# Patient Record
Sex: Female | Born: 1965 | Race: White | Hispanic: No | Marital: Married | Smoking: Current every day smoker
Health system: Southern US, Community
[De-identification: ages and names within clinical notes are randomized; demographics above are authoritative.]

## PROBLEM LIST (undated history)

## (undated) DIAGNOSIS — R609 Edema, unspecified: Secondary | ICD-10-CM

## (undated) DIAGNOSIS — I10 Essential (primary) hypertension: Secondary | ICD-10-CM

## (undated) DIAGNOSIS — R51 Headache: Secondary | ICD-10-CM

## (undated) HISTORY — PX: CHOLECYSTECTOMY: SHX55

## (undated) HISTORY — DX: Essential (primary) hypertension: I10

## (undated) HISTORY — DX: Edema, unspecified: R60.9

## (undated) HISTORY — PX: COLPOSCOPY: SHX161

## (undated) HISTORY — DX: Headache: R51

---

## 1998-05-25 ENCOUNTER — Other Ambulatory Visit: Admission: RE | Admit: 1998-05-25 | Discharge: 1998-05-25 | Payer: Self-pay | Admitting: Obstetrics and Gynecology

## 1999-06-18 ENCOUNTER — Other Ambulatory Visit: Admission: RE | Admit: 1999-06-18 | Discharge: 1999-06-18 | Payer: Self-pay | Admitting: Obstetrics and Gynecology

## 1999-07-25 ENCOUNTER — Encounter (INDEPENDENT_AMBULATORY_CARE_PROVIDER_SITE_OTHER): Payer: Self-pay

## 1999-07-25 ENCOUNTER — Other Ambulatory Visit: Admission: RE | Admit: 1999-07-25 | Discharge: 1999-07-25 | Payer: Self-pay | Admitting: Obstetrics and Gynecology

## 1999-10-29 ENCOUNTER — Other Ambulatory Visit: Admission: RE | Admit: 1999-10-29 | Discharge: 1999-10-29 | Payer: Self-pay | Admitting: Obstetrics and Gynecology

## 1999-12-30 ENCOUNTER — Other Ambulatory Visit: Admission: RE | Admit: 1999-12-30 | Discharge: 1999-12-30 | Payer: Self-pay | Admitting: Obstetrics and Gynecology

## 1999-12-30 ENCOUNTER — Encounter (INDEPENDENT_AMBULATORY_CARE_PROVIDER_SITE_OTHER): Payer: Self-pay | Admitting: Specialist

## 2000-06-29 ENCOUNTER — Other Ambulatory Visit: Admission: RE | Admit: 2000-06-29 | Discharge: 2000-06-29 | Payer: Self-pay | Admitting: Obstetrics and Gynecology

## 2001-06-29 ENCOUNTER — Other Ambulatory Visit: Admission: RE | Admit: 2001-06-29 | Discharge: 2001-06-29 | Payer: Self-pay | Admitting: Obstetrics and Gynecology

## 2002-08-09 ENCOUNTER — Other Ambulatory Visit: Admission: RE | Admit: 2002-08-09 | Discharge: 2002-08-09 | Payer: Self-pay | Admitting: Gynecology

## 2003-01-18 ENCOUNTER — Encounter: Admission: RE | Admit: 2003-01-18 | Discharge: 2003-01-18 | Payer: Self-pay | Admitting: Gynecology

## 2003-02-17 ENCOUNTER — Inpatient Hospital Stay (HOSPITAL_COMMUNITY): Admission: AD | Admit: 2003-02-17 | Discharge: 2003-02-17 | Payer: Self-pay | Admitting: Gynecology

## 2003-03-02 ENCOUNTER — Encounter (INDEPENDENT_AMBULATORY_CARE_PROVIDER_SITE_OTHER): Payer: Self-pay | Admitting: *Deleted

## 2003-03-02 ENCOUNTER — Inpatient Hospital Stay (HOSPITAL_COMMUNITY): Admission: AD | Admit: 2003-03-02 | Discharge: 2003-03-04 | Payer: Self-pay | Admitting: Gynecology

## 2003-04-18 ENCOUNTER — Other Ambulatory Visit: Admission: RE | Admit: 2003-04-18 | Discharge: 2003-04-18 | Payer: Self-pay | Admitting: Gynecology

## 2003-06-10 HISTORY — PX: CERVICAL BIOPSY  W/ LOOP ELECTRODE EXCISION: SUR135

## 2003-11-09 ENCOUNTER — Other Ambulatory Visit: Admission: RE | Admit: 2003-11-09 | Discharge: 2003-11-09 | Payer: Self-pay | Admitting: Gynecology

## 2004-05-10 ENCOUNTER — Other Ambulatory Visit: Admission: RE | Admit: 2004-05-10 | Discharge: 2004-05-10 | Payer: Self-pay | Admitting: Gynecology

## 2004-05-10 ENCOUNTER — Ambulatory Visit (HOSPITAL_COMMUNITY): Admission: RE | Admit: 2004-05-10 | Discharge: 2004-05-10 | Payer: Self-pay | Admitting: Obstetrics and Gynecology

## 2004-11-13 ENCOUNTER — Other Ambulatory Visit: Admission: RE | Admit: 2004-11-13 | Discharge: 2004-11-13 | Payer: Self-pay | Admitting: Gynecology

## 2005-04-28 ENCOUNTER — Other Ambulatory Visit: Admission: RE | Admit: 2005-04-28 | Discharge: 2005-04-28 | Payer: Self-pay | Admitting: Gynecology

## 2006-05-06 ENCOUNTER — Other Ambulatory Visit: Admission: RE | Admit: 2006-05-06 | Discharge: 2006-05-06 | Payer: Self-pay | Admitting: Obstetrics and Gynecology

## 2007-01-29 ENCOUNTER — Ambulatory Visit (HOSPITAL_COMMUNITY): Admission: RE | Admit: 2007-01-29 | Discharge: 2007-01-29 | Payer: Self-pay | Admitting: Obstetrics and Gynecology

## 2007-06-14 ENCOUNTER — Other Ambulatory Visit: Admission: RE | Admit: 2007-06-14 | Discharge: 2007-06-14 | Payer: Self-pay | Admitting: Obstetrics and Gynecology

## 2008-02-24 ENCOUNTER — Ambulatory Visit (HOSPITAL_COMMUNITY): Admission: RE | Admit: 2008-02-24 | Discharge: 2008-02-24 | Payer: Self-pay | Admitting: Obstetrics and Gynecology

## 2008-06-15 ENCOUNTER — Other Ambulatory Visit: Admission: RE | Admit: 2008-06-15 | Discharge: 2008-06-15 | Payer: Self-pay | Admitting: Obstetrics and Gynecology

## 2008-06-15 ENCOUNTER — Encounter: Payer: Self-pay | Admitting: Obstetrics and Gynecology

## 2008-06-15 ENCOUNTER — Ambulatory Visit: Payer: Self-pay | Admitting: Obstetrics and Gynecology

## 2009-08-09 ENCOUNTER — Other Ambulatory Visit: Admission: RE | Admit: 2009-08-09 | Discharge: 2009-08-09 | Payer: Self-pay | Admitting: Obstetrics and Gynecology

## 2009-08-09 ENCOUNTER — Ambulatory Visit: Payer: Self-pay | Admitting: Obstetrics and Gynecology

## 2009-08-10 ENCOUNTER — Ambulatory Visit (HOSPITAL_COMMUNITY): Admission: RE | Admit: 2009-08-10 | Discharge: 2009-08-10 | Payer: Self-pay | Admitting: Obstetrics and Gynecology

## 2010-07-01 ENCOUNTER — Encounter: Payer: Self-pay | Admitting: Obstetrics and Gynecology

## 2010-07-09 ENCOUNTER — Other Ambulatory Visit: Payer: Self-pay | Admitting: Obstetrics and Gynecology

## 2010-07-09 DIAGNOSIS — Z1231 Encounter for screening mammogram for malignant neoplasm of breast: Secondary | ICD-10-CM

## 2010-07-09 DIAGNOSIS — Z139 Encounter for screening, unspecified: Secondary | ICD-10-CM

## 2010-08-12 ENCOUNTER — Ambulatory Visit (HOSPITAL_COMMUNITY)
Admission: RE | Admit: 2010-08-12 | Discharge: 2010-08-12 | Disposition: A | Discharge status: Home / Self Care | Payer: Managed Care, Other (non HMO) | Source: Ambulatory Visit | Attending: Obstetrics and Gynecology | Admitting: Obstetrics and Gynecology

## 2010-08-12 DIAGNOSIS — Z1231 Encounter for screening mammogram for malignant neoplasm of breast: Secondary | ICD-10-CM

## 2010-08-13 ENCOUNTER — Encounter (INDEPENDENT_AMBULATORY_CARE_PROVIDER_SITE_OTHER): Payer: Managed Care, Other (non HMO) | Admitting: Obstetrics and Gynecology

## 2010-08-13 ENCOUNTER — Other Ambulatory Visit (HOSPITAL_COMMUNITY)
Admission: RE | Admit: 2010-08-13 | Discharge: 2010-08-13 | Disposition: A | Discharge status: Home / Self Care | Payer: Managed Care, Other (non HMO) | Source: Ambulatory Visit | Attending: Obstetrics and Gynecology | Admitting: Obstetrics and Gynecology

## 2010-08-13 ENCOUNTER — Other Ambulatory Visit: Payer: Self-pay | Admitting: Obstetrics and Gynecology

## 2010-08-13 DIAGNOSIS — Z13220 Encounter for screening for lipoid disorders: Secondary | ICD-10-CM

## 2010-08-13 DIAGNOSIS — Z01419 Encounter for gynecological examination (general) (routine) without abnormal findings: Secondary | ICD-10-CM

## 2010-08-13 DIAGNOSIS — R82998 Other abnormal findings in urine: Secondary | ICD-10-CM

## 2010-08-13 DIAGNOSIS — Z124 Encounter for screening for malignant neoplasm of cervix: Secondary | ICD-10-CM | POA: Insufficient documentation

## 2010-08-28 ENCOUNTER — Ambulatory Visit (INDEPENDENT_AMBULATORY_CARE_PROVIDER_SITE_OTHER): Payer: Managed Care, Other (non HMO) | Admitting: Obstetrics and Gynecology

## 2010-08-28 DIAGNOSIS — N920 Excessive and frequent menstruation with regular cycle: Secondary | ICD-10-CM

## 2010-09-04 ENCOUNTER — Ambulatory Visit (INDEPENDENT_AMBULATORY_CARE_PROVIDER_SITE_OTHER): Payer: Managed Care, Other (non HMO) | Admitting: Obstetrics and Gynecology

## 2010-09-04 ENCOUNTER — Other Ambulatory Visit: Payer: Managed Care, Other (non HMO)

## 2010-09-04 DIAGNOSIS — N8000 Endometriosis of the uterus, unspecified: Secondary | ICD-10-CM

## 2010-09-04 DIAGNOSIS — N920 Excessive and frequent menstruation with regular cycle: Secondary | ICD-10-CM

## 2010-09-04 DIAGNOSIS — Z30431 Encounter for routine checking of intrauterine contraceptive device: Secondary | ICD-10-CM

## 2010-09-16 ENCOUNTER — Ambulatory Visit (INDEPENDENT_AMBULATORY_CARE_PROVIDER_SITE_OTHER): Payer: Managed Care, Other (non HMO) | Admitting: Gynecology

## 2010-09-16 DIAGNOSIS — N920 Excessive and frequent menstruation with regular cycle: Secondary | ICD-10-CM

## 2010-10-07 ENCOUNTER — Ambulatory Visit (INDEPENDENT_AMBULATORY_CARE_PROVIDER_SITE_OTHER): Payer: Managed Care, Other (non HMO) | Admitting: Obstetrics and Gynecology

## 2010-10-07 DIAGNOSIS — N949 Unspecified condition associated with female genital organs and menstrual cycle: Secondary | ICD-10-CM

## 2010-10-25 NOTE — Discharge Summary (Signed)
   NAME:  Kelli Adams, Kelli Adams                    ACCOUNT NO.:  [id number]   MEDICAL RECORD NO.:  [id number]                   PATIENT TYPE:  INP   LOCATION:  9101                                 FACILITY:  [facility]   PHYSICIAN:  Juan H. Lily Peer, M.D.             DATE OF BIRTH:  April 22, 1966   DATE OF ADMISSION:  03/02/2003  DATE OF DISCHARGE:  03/04/2003                                 DISCHARGE SUMMARY   DISCHARGE DIAGNOSES:  1. Intrauterine pregnancy at 39 weeks delivered.  2. Pregnancy-induced hypertension.  3. Status post spontaneous vaginal delivery.   HISTORY:  This is a 45 year old female gravida 2, para 0, abortus 1 with an  EDC of March 05, 2003.  The patient was to be 45 years of age at the  time of birth, declined amniocentesis.  She was followed for pregnancy-  induced hypertension and she has been on bed rest secondary to the increased  blood pressure.   HOSPITAL COURSE:  On March 02, 2003 the patient was admitted at 39+  weeks for induction secondary to [facility].  Pitocin was begun and subsequently on  March 02, 2003 the patient underwent spontaneous vaginal delivery of a  female.  Apgars of 9 and 9, weight of 6 pounds and 10 ounces.  There was a  first-degree laceration which was repaired over the perineum.  There were no  complications.  Postpartum the patient remained afebrile, voiding, in stable  condition.  She was discharged to home on March 04, 2003 in satisfactory  condition.  Given [facility] brochure and postpartum booklet.   PHYSICAL EXAMINATION:  No findings.   LABORATORY AND ACCESSORY DATA:  The patient O positive, rubella immune.  On  March 03, 2003 hemoglobin was 12.3.   DISCHARGE INSTRUCTIONS:  The patient was discharged to home, follow up in  six weeks, given a prescription for Demerol 50 mg 1/2 tab p.o. q.4-6 h.  p.r.n. pain.     Susa Loffler, P.A.                    Juan H. Lily Peer, M.D.    TSG/MEDQ  D:  04/07/2003   T:  04/07/2003  Job:  [id number]

## 2011-07-09 ENCOUNTER — Other Ambulatory Visit: Payer: Self-pay | Admitting: Obstetrics and Gynecology

## 2011-07-09 DIAGNOSIS — Z1231 Encounter for screening mammogram for malignant neoplasm of breast: Secondary | ICD-10-CM

## 2011-08-12 ENCOUNTER — Encounter: Payer: Self-pay | Admitting: Gynecology

## 2011-08-12 DIAGNOSIS — N879 Dysplasia of cervix uteri, unspecified: Secondary | ICD-10-CM | POA: Insufficient documentation

## 2011-08-13 ENCOUNTER — Ambulatory Visit (HOSPITAL_COMMUNITY)
Admission: RE | Admit: 2011-08-13 | Discharge: 2011-08-13 | Disposition: A | Discharge status: Home / Self Care | Payer: Managed Care, Other (non HMO) | Source: Ambulatory Visit | Attending: Obstetrics and Gynecology | Admitting: Obstetrics and Gynecology

## 2011-08-13 DIAGNOSIS — Z1231 Encounter for screening mammogram for malignant neoplasm of breast: Secondary | ICD-10-CM | POA: Insufficient documentation

## 2011-08-19 ENCOUNTER — Other Ambulatory Visit (HOSPITAL_COMMUNITY)
Admission: RE | Admit: 2011-08-19 | Discharge: 2011-08-19 | Disposition: A | Discharge status: Home / Self Care | Payer: Managed Care, Other (non HMO) | Source: Ambulatory Visit | Attending: Obstetrics and Gynecology | Admitting: Obstetrics and Gynecology

## 2011-08-19 ENCOUNTER — Ambulatory Visit (INDEPENDENT_AMBULATORY_CARE_PROVIDER_SITE_OTHER): Payer: Managed Care, Other (non HMO) | Admitting: Obstetrics and Gynecology

## 2011-08-19 ENCOUNTER — Encounter: Payer: Self-pay | Admitting: Obstetrics and Gynecology

## 2011-08-19 VITALS — BP 120/80 | Ht 62.0 in | Wt 170.0 lb

## 2011-08-19 DIAGNOSIS — R51 Headache: Secondary | ICD-10-CM | POA: Insufficient documentation

## 2011-08-19 DIAGNOSIS — Z01419 Encounter for gynecological examination (general) (routine) without abnormal findings: Secondary | ICD-10-CM

## 2011-08-19 DIAGNOSIS — R609 Edema, unspecified: Secondary | ICD-10-CM | POA: Insufficient documentation

## 2011-08-19 DIAGNOSIS — I10 Essential (primary) hypertension: Secondary | ICD-10-CM | POA: Insufficient documentation

## 2011-08-19 DIAGNOSIS — R519 Headache, unspecified: Secondary | ICD-10-CM | POA: Insufficient documentation

## 2011-08-19 NOTE — Progress Notes (Signed)
The patient came back to see me today for her annual GYN exam. Since we placed her Mirena IUD her severe menorrhagia has been replaced by very light spotting for several days. She does however still gets  one day of severe dysmenorrhea. She will occasionally get pain on her left side with increased activity and she wonders if it's related to her IUD been misplaced. She had her mammogram this week. She does lab through PCP. She has recently noticed a lump in her right hip.  Physical examination: HEENT within normal limits. Neck: Thyroid not large. No masses. Supraclavicular nodes: not enlarged. Breasts: Examined in both sitting midline position. No skin changes and no masses. Abdomen: Soft no guarding rebound or masses or hernia. Pelvic: External: Within normal limits. BUS: Within normal limits. Vaginal:within normal limits. Good estrogen effect. No evidence of cystocele rectocele or enterocele. Cervix: clean IUD in place. Uterus: Normal size and shape. Adnexa: No masses. Rectovaginal exam: Confirmatory and negative. Extremities: Within normal limits. Skin: On her right lateral thigh there is a 2-3 mm firm nodule consistent with benign growth.  Assessment: 1. Relief of menorrhagia with IUD. #2. Dysmenorrhea for one day #3. Small nodule of right thigh.  Plan: Patient will use ibuprofen for the dysmenorrhea. Reassured IUD in place. However explained to her that she could have some endo myometrial penetration from her IUD and if it was annoying enough to her ultrasound could be used to make a diagnosis. For the moment she declined. She will watch the nodule on her thigh and report any growth to me.

## 2011-08-20 LAB — URINALYSIS W MICROSCOPIC + REFLEX CULTURE
Bacteria, UA: NONE SEEN
Bilirubin Urine: NEGATIVE
Casts: NONE SEEN
Glucose, UA: NEGATIVE mg/dL
Hgb urine dipstick: NEGATIVE
Ketones, ur: NEGATIVE mg/dL
Nitrite: NEGATIVE
Protein, ur: NEGATIVE mg/dL
Specific Gravity, Urine: 1.016 (ref 1.005–1.030)
Urobilinogen, UA: 0.2 mg/dL (ref 0.0–1.0)
pH: 6 (ref 5.0–8.0)

## 2011-08-21 LAB — URINE CULTURE
Colony Count: NO GROWTH
Organism ID, Bacteria: NO GROWTH

## 2012-07-19 ENCOUNTER — Other Ambulatory Visit: Payer: Self-pay | Admitting: Obstetrics and Gynecology

## 2012-07-19 DIAGNOSIS — Z1231 Encounter for screening mammogram for malignant neoplasm of breast: Secondary | ICD-10-CM

## 2012-08-12 ENCOUNTER — Other Ambulatory Visit: Payer: Self-pay | Admitting: Gynecology

## 2012-08-13 ENCOUNTER — Ambulatory Visit (HOSPITAL_COMMUNITY): Payer: Managed Care, Other (non HMO)

## 2012-08-23 ENCOUNTER — Ambulatory Visit: Payer: BC Managed Care – PPO | Admitting: Women's Health

## 2012-08-23 ENCOUNTER — Encounter: Payer: Self-pay | Admitting: Women's Health

## 2012-08-23 ENCOUNTER — Other Ambulatory Visit (HOSPITAL_COMMUNITY)
Admission: RE | Admit: 2012-08-23 | Discharge: 2012-08-23 | Disposition: A | Discharge status: Home / Self Care | Payer: BC Managed Care – PPO | Source: Ambulatory Visit | Attending: Obstetrics and Gynecology | Admitting: Obstetrics and Gynecology

## 2012-08-23 ENCOUNTER — Telehealth: Payer: Self-pay

## 2012-08-23 ENCOUNTER — Ambulatory Visit (HOSPITAL_COMMUNITY)
Admission: RE | Admit: 2012-08-23 | Discharge: 2012-08-23 | Disposition: A | Discharge status: Home / Self Care | Payer: BC Managed Care – PPO | Source: Ambulatory Visit | Attending: Gynecology | Admitting: Gynecology

## 2012-08-23 ENCOUNTER — Ambulatory Visit (HOSPITAL_COMMUNITY): Payer: Managed Care, Other (non HMO)

## 2012-08-23 VITALS — BP 124/72 | Ht 63.0 in | Wt 164.0 lb

## 2012-08-23 DIAGNOSIS — Z01419 Encounter for gynecological examination (general) (routine) without abnormal findings: Secondary | ICD-10-CM

## 2012-08-23 DIAGNOSIS — Z1231 Encounter for screening mammogram for malignant neoplasm of breast: Secondary | ICD-10-CM | POA: Insufficient documentation

## 2012-08-23 LAB — PREGNANCY, URINE: Preg Test, Ur: NEGATIVE

## 2012-08-23 NOTE — Patient Instructions (Addendum)

## 2012-08-23 NOTE — Progress Notes (Signed)
Kelli Adams [birth year]    History:    The patient presents for annual exam.  Occasional spotting with Mirena IUD placed 08/2010, good relief of menorrhagia. 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 Normal mammograms. Hypertension/anxiety/depression-primary care meds. Smokes half pack of cigarettes per day. Negative sonohysterogram 08/2010, cortical cysts consistent with adenomyosis noted on uterus.   Past medical history, past surgical history, family history and social history were all reviewed and documented in the EPIC chart. Works for [employer]. [employer], skipped a grade. Benign cervical polyp 2010.   ROS:  A  ROS was performed and pertinent positives and negatives are included in the history.  Exam:  Filed Vitals:   08/23/12 0815  BP: 124/72    General appearance:  Normal Head/Neck:  Normal, without cervical or supraclavicular adenopathy. Thyroid:  Symmetrical, normal in size, without palpable masses or nodularity. Respiratory  Effort:  Normal  Auscultation:  Clear without wheezing or rhonchi Cardiovascular  Auscultation:  Regular rate, without rubs, murmurs or gallops  Edema/varicosities:  Not grossly evident Abdominal  Soft,nontender, without masses, guarding or rebound.  Liver/spleen:  No organomegaly noted  Hernia:  None appreciated  Skin  Inspection:  Grossly normal  Palpation:  Grossly normal Neurologic/psychiatric  Orientation:  Normal with appropriate conversation.  Mood/affect:  Normal  Genitourinary    Breasts: Examined lying and sitting.     Right: Without masses, retractions, discharge or axillary adenopathy.     Left: Without masses, retractions, discharge or axillary adenopathy.   Inguinal/mons:  Normal without inguinal adenopathy  External genitalia:  Normal  BUS/Urethra/Skene's glands:  Normal  Bladder:  Normal  Vagina:  Normal  Cervix:  Normal, IUD strings visible  Uterus:   normal in size,  shape and contour.  Midline and mobile  Adnexa/parametria:     Rt: Without masses or tenderness.   Lt: Without masses or tenderness.  Anus and perineum: Normal  Digital rectal exam: Normal sphincter tone without palpated masses or tenderness  Assessment/Plan:  47 y.o.  MWF G3P1 for annual exam with no complaints.  Mirena IUD occasional spotting placed 08/2010 CIN-3-LEEP 2005 normal Paps after Hypertension/anxiety/depression-labs and meds primary care Smoker less than half pack per day  Plan: Continue regular exercise, SBE's, annual mammogram, calcium rich diet, vitamin D 1000 daily encouraged. Aware of hazards of smoking, discussed weeks to decrease/quit smoking. Has had some a.m. Nausea questions pregnancy, U PT negative. UA, Pap.    Harrington Challenger [facility], 9:15 AM 08/23/2012

## 2012-08-23 NOTE — Progress Notes (Signed)
SPOTTING ONLY WITH IUD

## 2012-08-23 NOTE — Telephone Encounter (Signed)
Patient called stating she needs a doctor's note from you stating that she had to be home to rest today.  She saw you for her RGCE this morning first thing.  I did not see mention in your office note that absence from work would be necessary today.  I can simply write that letter for you if that is something you wish me to do for her. Just let me know.

## 2012-08-23 NOTE — Telephone Encounter (Signed)
She was somewhat nauseous, I am guessing that's why she stayed out of work. Okay to write a note for today. thanks

## 2012-08-23 NOTE — Addendum Note (Signed)
Addended by: Richardson Chiquito on: 08/23/2012 09:32 AM   Modules accepted: Orders

## 2012-08-24 ENCOUNTER — Encounter: Payer: Self-pay | Admitting: Obstetrics and Gynecology

## 2012-08-24 LAB — URINALYSIS W MICROSCOPIC + REFLEX CULTURE
Bacteria, UA: NONE SEEN
Casts: NONE SEEN
Crystals: NONE SEEN
Glucose, UA: NEGATIVE mg/dL
Leukocytes, UA: NEGATIVE
Nitrite: NEGATIVE
Protein, ur: NEGATIVE mg/dL
Specific Gravity, Urine: 1.029 (ref 1.005–1.030)
Urobilinogen, UA: 0.2 mg/dL (ref 0.0–1.0)
pH: 6.5 (ref 5.0–8.0)

## 2012-08-24 NOTE — Telephone Encounter (Signed)
Letter faxed to [employer] at patients request per [place]. Copy is in e-chart.

## 2013-02-24 ENCOUNTER — Ambulatory Visit (INDEPENDENT_AMBULATORY_CARE_PROVIDER_SITE_OTHER): Payer: BC Managed Care – PPO | Admitting: Women's Health

## 2013-02-24 ENCOUNTER — Encounter: Payer: Self-pay | Admitting: Women's Health

## 2013-02-24 DIAGNOSIS — F411 Generalized anxiety disorder: Secondary | ICD-10-CM

## 2013-02-24 DIAGNOSIS — N951 Menopausal and female climacteric states: Secondary | ICD-10-CM

## 2013-02-24 MED ORDER — CITALOPRAM HYDROBROMIDE 20 MG PO TABS: 40.0000 mg | ORAL_TABLET | Freq: Every day | ORAL | Status: DC

## 2013-02-24 MED ORDER — PROMETHAZINE HCL 25 MG PO TABS: ORAL_TABLET | ORAL | Status: DC

## 2013-02-24 NOTE — Progress Notes (Signed)
Patient ID: Kelli Adams, female   DOB: 08/17/1965, 47 y.o.   MRN: [id number] Presents with complaint of anxiety, tension, unable to sleep, nausea with no vomiting, frequent crying and generally feeling poorly. Upper back tension. Has noticed thinning hair, increased hot flushes. Mirena IUD with rare cycles, last cycle 4 days ago. Denies any precipitating factors, states home life is good, work is okay, son is doing well. States all her primary care who recommended her to see a psychiatrist. 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 Denies any feelings of harming self. Has been on Celexa 20 mg for about 10 years. Denies alcohol use.  Exam: Upset, crying, states unable to relax. Abdomen soft, nontender, external genitalia within normal limits, speculum exam moderate amount of menses type blood noted, IUD strings visible. Bimanual no CMT or adnexal fullness or tenderness.  Anxiety  Plan: TSH, FSH, schedule followup with psychiatrist for medication, we will get referral to Dr Ann Maki [facility] office. Will increase Celexa from 20-40 mg daily, reviewed importance of increasing regular exercise for stress relief, delegating chores at home. Phenergan 25 for nausea reviewed to take in the evening only not prior to driving or work.

## 2013-02-24 NOTE — Patient Instructions (Addendum)

## 2013-02-25 ENCOUNTER — Telehealth: Payer: Self-pay | Admitting: *Deleted

## 2013-02-25 NOTE — Telephone Encounter (Signed)
Per Harriett Sine she would like patient to referral to psychiatrist for medication. Pt will be contacted by [facility] outpatient behavioral health [phone] I was informed to fax notes and they will contact pt to scheduled, so this was done I called pt and told her this.

## 2013-03-07 NOTE — Telephone Encounter (Signed)
Left message on pt voicemail to see if scheduled for appointment.

## 2013-03-09 MED ORDER — PROPRANOLOL HCL 40 MG PO TABS: 40.0000 mg | ORAL_TABLET | Freq: Three times a day (TID) | ORAL | Status: DC | PRN

## 2013-03-09 NOTE — Telephone Encounter (Signed)
Has she increased the Celexa from 20-40, is that helping at all?  She cannot take a Xanax-type /NO benzodiazepines medication, history of seizures with Xanax.

## 2013-03-09 NOTE — Telephone Encounter (Signed)
.  per nancy verbally it should be 40 mg not 80 mg. So 40 mg was sent to pharmacy. Pt informed.

## 2013-03-09 NOTE — Telephone Encounter (Signed)
Pt has appointment scheduled with cone at the end of the month, pt said is there anything she could take until then? Please advise

## 2013-03-09 NOTE — Telephone Encounter (Signed)
Could try propranolol 80mg  every 8 hrs prn  #30 no refills

## 2013-04-08 ENCOUNTER — Ambulatory Visit (HOSPITAL_COMMUNITY): Payer: BC Managed Care – PPO | Admitting: Psychiatry

## 2013-04-14 ENCOUNTER — Other Ambulatory Visit: Payer: Self-pay

## 2013-04-19 ENCOUNTER — Ambulatory Visit (INDEPENDENT_AMBULATORY_CARE_PROVIDER_SITE_OTHER): Payer: BC Managed Care – PPO | Admitting: Women's Health

## 2013-04-19 ENCOUNTER — Encounter: Payer: Self-pay | Admitting: Women's Health

## 2013-04-19 DIAGNOSIS — Z30432 Encounter for removal of intrauterine contraceptive device: Secondary | ICD-10-CM

## 2013-04-19 NOTE — Progress Notes (Signed)
Patient ID: Kelli Adams, female   DOB: [birth year], 47 y.o.   MRN: [id number] Presents to have Mirena IUD removed. Mirena IUD placed 07/2010 for menorrhagia. Has been amenorrheic. Rare intercourse. Struggling with severe anxiety and panic. Reports increased hair loss since Mirena placed. Seeing a counselor and psychiatrist without much relief. Denies feelings of harming self. Problems with insomnia. 70 year old son Vickki Muff still sleeps with her.  Exam: Appears sad, tearful at times. External genitalia within normal limits, speculum exam IUD strings visible, IUD removed intact, shown to patient and discarded.  Anxiety Contraception management  Plan: Contraception options reviewed, will use condoms. Encouraged to continue meds as prescribed, counseling, try a different counselor, Raynelle Fanning Whitt's name and number given. Instructed to call if problems with cycles, reviewed may not get a cycle immediately. Reviewed importance of regular exercise. Sleep hygiene reviewed, encouraged to try to have son start sleeping on his own.

## 2013-04-19 NOTE — Patient Instructions (Signed)

## 2013-04-20 ENCOUNTER — Ambulatory Visit: Payer: BC Managed Care – PPO | Admitting: Women's Health

## 2013-10-25 ENCOUNTER — Other Ambulatory Visit: Payer: Self-pay | Admitting: Women's Health

## 2013-10-25 DIAGNOSIS — Z1231 Encounter for screening mammogram for malignant neoplasm of breast: Secondary | ICD-10-CM

## 2013-11-04 ENCOUNTER — Ambulatory Visit (HOSPITAL_COMMUNITY): Payer: BC Managed Care – PPO

## 2013-11-07 ENCOUNTER — Ambulatory Visit (HOSPITAL_COMMUNITY)
Admission: RE | Admit: 2013-11-07 | Discharge: 2013-11-07 | Disposition: A | Discharge status: Home / Self Care | Payer: BC Managed Care – PPO | Source: Ambulatory Visit | Attending: Women's Health | Admitting: Women's Health

## 2013-11-07 DIAGNOSIS — Z1231 Encounter for screening mammogram for malignant neoplasm of breast: Secondary | ICD-10-CM | POA: Insufficient documentation

## 2013-11-09 ENCOUNTER — Encounter: Payer: Self-pay | Admitting: Women's Health

## 2014-04-10 ENCOUNTER — Encounter: Payer: Self-pay | Admitting: Women's Health

## 2015-07-10 ENCOUNTER — Other Ambulatory Visit: Payer: Self-pay

## 2015-07-10 DIAGNOSIS — Z1231 Encounter for screening mammogram for malignant neoplasm of breast: Secondary | ICD-10-CM

## 2015-07-18 ENCOUNTER — Encounter: Payer: Self-pay | Admitting: Women's Health

## 2015-07-18 ENCOUNTER — Other Ambulatory Visit: Payer: Self-pay | Admitting: Women's Health

## 2015-07-18 ENCOUNTER — Other Ambulatory Visit (HOSPITAL_COMMUNITY)
Admission: RE | Admit: 2015-07-18 | Discharge: 2015-07-18 | Disposition: A | Discharge status: Home / Self Care | Payer: 59 | Source: Ambulatory Visit | Attending: Gynecology | Admitting: Gynecology

## 2015-07-18 ENCOUNTER — Ambulatory Visit
Admission: RE | Admit: 2015-07-18 | Discharge: 2015-07-18 | Disposition: A | Discharge status: Home / Self Care | Payer: 59 | Source: Ambulatory Visit

## 2015-07-18 ENCOUNTER — Ambulatory Visit (INDEPENDENT_AMBULATORY_CARE_PROVIDER_SITE_OTHER): Payer: 59 | Admitting: Women's Health

## 2015-07-18 VITALS — BP 132/80 | Ht 62.0 in | Wt 189.0 lb

## 2015-07-18 DIAGNOSIS — Z1329 Encounter for screening for other suspected endocrine disorder: Secondary | ICD-10-CM | POA: Diagnosis not present

## 2015-07-18 DIAGNOSIS — Z01419 Encounter for gynecological examination (general) (routine) without abnormal findings: Secondary | ICD-10-CM | POA: Insufficient documentation

## 2015-07-18 DIAGNOSIS — Z1231 Encounter for screening mammogram for malignant neoplasm of breast: Secondary | ICD-10-CM

## 2015-07-18 DIAGNOSIS — Z833 Family history of diabetes mellitus: Secondary | ICD-10-CM

## 2015-07-18 NOTE — Addendum Note (Signed)
Addended by: Burnett Kanaris on: 07/18/2015 04:10 PM   Modules accepted: Orders, SmartSet

## 2015-07-18 NOTE — Progress Notes (Signed)
[place] 1966-03-06 [id number]    History:    Presents for annual exam.  Monthly cycle/rare intercourse/condoms. 2005 LEEP for CIN-3 negative margins, with normal Paps after.  Mammogram history normal. History of hypertension and anxiety and depression-primary care manages. Normal labs at health screening, states blood sugar slightly elevated. Negative colonoscopy less than 10 years ago was having GI issues. History of GDM.  Past medical history, past surgical history, family history and social history were all reviewed and documented in the EPIC chart. 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  ROS:  A ROS was performed and pertinent positives and negatives are included.  Exam:  Filed Vitals:   07/18/15 1442  BP: 132/80    General appearance:  Normal Thyroid:  Symmetrical, normal in size, without palpable masses or nodularity. Respiratory  Auscultation:  Clear without wheezing or rhonchi Cardiovascular  Auscultation:  Regular rate, without rubs, murmurs or gallops  Edema/varicosities:  Not grossly evident Abdominal  Soft,nontender, without masses, guarding or rebound.  Liver/spleen:  No organomegaly noted  Hernia:  None appreciated  Skin  Inspection:  Grossly normal   Breasts: Examined lying and sitting.     Right: Without masses, retractions, discharge or axillary adenopathy.     Left: Without masses, retractions, discharge or axillary adenopathy. Gentitourinary   Inguinal/mons:  Normal without inguinal adenopathy  External genitalia:  Normal  BUS/Urethra/Skene's glands:  Normal  Vagina:  Normal  Cervix:  Normal stenotic  Uterus:  normal in size, shape and contour.  Midline and mobile  Adnexa/parametria:     Rt: Without masses or tenderness.   Lt: Without masses or tenderness.  Anus and perineum: Normal  Digital rectal exam: Normal sphincter tone without palpated masses or tenderness  Assessment/Plan:  50 y.o. MWF G3 P1 for annual exam with no  complaints.   Regular monthly cycle/rare intercourse/condoms 2005 LEEP for CIN-3 normal Paps after Hypertension, anxiety and depression-primary care manages labs and meds Smoker  Plan: SBE's, continue annual screening mammogram. Reviewed importance of decreasing calories, increasing exercise, has gained approximately 20 pounds in the past 2 years. Calcium rich diet, vitamin D 1000 daily encouraged. Aware of hazards of smoking, smoking cessation reviewed. TSH, hemoglobin A1c, UA, Pap with HR HPV typing.      Huel Cote Navos, 3:31 PM 07/18/2015

## 2015-07-18 NOTE — Patient Instructions (Signed)
Menopause is a normal process in which your reproductive ability comes to an end. This process happens gradually over a span of months to years, usually between the ages of 48 and 55. Menopause is complete when you have missed 12 consecutive menstrual periods. It is important to talk with your health care provider about some of the most common conditions that affect postmenopausal women, such as heart disease, cancer, and bone loss (osteoporosis). Adopting a healthy lifestyle and getting preventive care can help to promote your health and wellness. Those actions can also lower your chances of developing some of these common conditions. WHAT SHOULD I KNOW ABOUT MENOPAUSE? During menopause, you may experience a number of symptoms, such as:  Moderate-to-severe hot flashes.  Night sweats.  Decrease in sex drive.  Mood swings.  Headaches.  Tiredness.  Irritability.  Memory problems.  Insomnia. Choosing to treat or not to treat menopausal changes is an individual decision that you make with your health care provider. WHAT SHOULD I KNOW ABOUT HORMONE REPLACEMENT THERAPY AND SUPPLEMENTS? Hormone therapy products are effective for treating symptoms that are associated with menopause, such as hot flashes and night sweats. Hormone replacement carries certain risks, especially as you become older. If you are thinking about using estrogen or estrogen with progestin treatments, discuss the benefits and risks with your health care provider. WHAT SHOULD I KNOW ABOUT HEART DISEASE AND STROKE? Heart disease, heart attack, and stroke become more likely as you age. This may be due, in part, to the hormonal changes that your body experiences during menopause. These can affect how your body processes dietary fats, triglycerides, and cholesterol. Heart attack and stroke are both medical emergencies. There are many things that you can do to help prevent heart disease and stroke:  Have your blood pressure  checked at least every 1-2 years. High blood pressure causes heart disease and increases the risk of stroke.  If you are 55-79 years old, ask your health care provider if you should take aspirin to prevent a heart attack or a stroke.  Do not use any tobacco products, including cigarettes, chewing tobacco, or electronic cigarettes. If you need help quitting, ask your health care provider.  It is important to eat a healthy diet and maintain a healthy weight.  Be sure to include plenty of vegetables, fruits, low-fat dairy products, and lean protein.  Avoid eating foods that are high in solid fats, added sugars, or salt (sodium).  Get regular exercise. This is one of the most important things that you can do for your health.  Try to exercise for at least 150 minutes each week. The type of exercise that you do should increase your heart rate and make you sweat. This is known as moderate-intensity exercise.  Try to do strengthening exercises at least twice each week. Do these in addition to the moderate-intensity exercise.  Know your numbers.Ask your health care provider to check your cholesterol and your blood glucose. Continue to have your blood tested as directed by your health care provider. WHAT SHOULD I KNOW ABOUT CANCER SCREENING? There are several types of cancer. Take the following steps to reduce your risk and to catch any cancer development as early as possible. Breast Cancer  Practice breast self-awareness.  This means understanding how your breasts normally appear and feel.  It also means doing regular breast self-exams. Let your health care provider know about any changes, no matter how small.  If you are 40 or older, have a clinician do a   breast exam (clinical breast exam or CBE) every year. Depending on your age, family history, and medical history, it may be recommended that you also have a yearly breast X-ray (mammogram).  If you have a family history of breast cancer,  talk with your health care provider about genetic screening.  If you are at high risk for breast cancer, talk with your health care provider about having an MRI and a mammogram every year.  Breast cancer (BRCA) gene test is recommended for women who have family members with BRCA-related cancers. Results of the assessment will determine the need for genetic counseling and BRCA1 and for BRCA2 testing. BRCA-related cancers include these types:  Breast. This occurs in males or females.  Ovarian.  Tubal. This may also be called fallopian tube cancer.  Cancer of the abdominal or pelvic lining (peritoneal cancer).  Prostate.  Pancreatic. Cervical, Uterine, and Ovarian Cancer Your health care provider may recommend that you be screened regularly for cancer of the pelvic organs. These include your ovaries, uterus, and vagina. This screening involves a pelvic exam, which includes checking for microscopic changes to the surface of your cervix (Pap test).  For women ages 21-65, health care providers may recommend a pelvic exam and a Pap test every three years. For women ages 77-65, they may recommend the Pap test and pelvic exam, combined with testing for human papilloma virus (HPV), every five years. Some types of HPV increase your risk of cervical cancer. Testing for HPV may also be done on women of any age who have unclear Pap test results.  Other health care providers may not recommend any screening for nonpregnant women who are considered low risk for pelvic cancer and have no symptoms. Ask your health care provider if a screening pelvic exam is right for you.  If you have had past treatment for cervical cancer or a condition that could lead to cancer, you need Pap tests and screening for cancer for at least 20 years after your treatment. If Pap tests have been discontinued for you, your risk factors (such as having a new sexual partner) need to be reassessed to determine if you should start having  screenings again. Some women have medical problems that increase the chance of getting cervical cancer. In these cases, your health care provider may recommend that you have screening and Pap tests more often.  If you have a family history of uterine cancer or ovarian cancer, talk with your health care provider about genetic screening.  If you have vaginal bleeding after reaching menopause, tell your health care provider.  There are currently no reliable tests available to screen for ovarian cancer. Lung Cancer Lung cancer screening is recommended for adults 3-70 years old who are at high risk for lung cancer because of a history of smoking. A yearly low-dose CT scan of the lungs is recommended if you:  Currently smoke.  Have a history of at least 30 pack-years of smoking and you currently smoke or have quit within the past 15 years. A pack-year is smoking an average of one pack of cigarettes per day for one year. Yearly screening should:  Continue until it has been 15 years since you quit.  Stop if you develop a health problem that would prevent you from having lung cancer treatment. Colorectal Cancer  This type of cancer can be detected and can often be prevented.  Routine colorectal cancer screening usually begins at age 38 and continues through age 12.  If you have  risk factors for colon cancer, your health care provider may recommend that you be screened at an earlier age.  If you have a family history of colorectal cancer, talk with your health care provider about genetic screening.  Your health care provider may also recommend using home test kits to check for hidden blood in your stool.  A small camera at the end of a tube can be used to examine your colon directly (sigmoidoscopy or colonoscopy). This is done to check for the earliest forms of colorectal cancer.  Direct examination of the colon should be repeated every 5-10 years until age 67. However, if early forms of  precancerous polyps or small growths are found or if you have a family history or genetic risk for colorectal cancer, you may need to be screened more often. Skin Cancer  Check your skin from head to toe regularly.  Monitor any moles. Be sure to tell your health care provider:  About any new moles or changes in moles, especially if there is a change in a mole's shape or color.  If you have a mole that is larger than the size of a pencil eraser.  If any of your family members has a history of skin cancer, especially at a [place]ora Haw[place]aker age, talk with your health care provider about genetic screening.  Always use sunscreen. Apply sunscreen liberally and repeatedly throughout the day.  Whenever you are outside, protect yourself by wearing long sleeves, pants, a wide-brimmed hat, and sunglasses. WHAT SHOULD I KNOW ABOUT OSTEOPOROSIS? Osteoporosis is a condition in which bone destruction happens more quickly than new bone creation. After menopause, you may be at an increased risk for osteoporosis. To help prevent osteoporosis or the bone fractures that can happen because of osteoporosis, the following is recommended:  If you are 39-61 years old, get at least 1,000 mg of calcium and at least 600 mg of vitamin D per day.  If you are older than age 16 but younger than age 7, get at least 1,200 mg of calcium and at least 600 mg of vitamin D per day.  If you are older than age 47, get at least 1,200 mg of calcium and at least 800 mg of vitamin D per day. Smoking and excessive alcohol intake increase the risk of osteoporosis. Eat foods that are rich in calcium and vitamin D, and do weight-bearing exercises several times each week as directed by your health care provider. WHAT SHOULD I KNOW ABOUT HOW MENOPAUSE AFFECTS [place]? Depression may occur at any age, but it is more common as you become older. Common symptoms of depression include:  Low or sad mood.  Changes in sleep patterns.  Changes  in appetite or eating patterns.  Feeling an overall lack of motivation or enjoyment of activities that you previously enjoyed.  Frequent crying spells. Talk with your health care provider if you think that you are experiencing depression. WHAT SHOULD I KNOW ABOUT IMMUNIZATIONS? It is important that you get and maintain your immunizations. These include:  Tetanus, diphtheria, and pertussis (Tdap) booster vaccine.  Influenza every year before the flu season begins.  Pneumonia vaccine.  Shingles vaccine. Your health care provider may also recommend other immunizations.   This information is not intended to replace advice given to you by your health care provider. Make sure you discuss any questions you have with your health care provider.   Document Released: 07/18/2005 Document Revised: 06/16/2014 Document Reviewed: 01/26/2014 Elsevier Interactive Patient Education 2016 [employer] Basic Carbohydrate Counting for Diabetes Mellitus Carbohydrate counting is a method for keeping track of the amount of carbohydrates you eat. Eating carbohydrates naturally increases the level of sugar (glucose) in your blood, so it is important for you to know the amount that is okay for you to have in every meal. Carbohydrate counting helps keep the level of glucose in your blood within normal limits. The amount of carbohydrates allowed is different for every person. A dietitian can help you calculate the amount that is right for you. Once you know the amount of carbohydrates you can have, you can count the carbohydrates in the foods you want to eat. Carbohydrates are found in the following foods:  Grains, such as breads and cereals.  Dried beans and soy products.  Starchy vegetables, such as potatoes, peas, and corn.  Fruit and fruit juices.  Milk and yogurt.  Sweets and snack foods, such as cake, cookies, candy, chips, soft drinks, and fruit drinks. CARBOHYDRATE COUNTING There are two ways to  count the carbohydrates in your food. You can use either of the methods or a combination of both. Reading the "Nutrition Facts" on Packaged Food The "Nutrition Facts" is an area that is included on the labels of almost all packaged food and beverages in the [place]. It includes the serving size of that food or beverage and information about the nutrients in each serving of the food, including the grams (g) of carbohydrate per serving.  Decide the number of servings of this food or beverage that you will be able to eat or drink. Multiply that number of servings by the number of grams of carbohydrate that is listed on the label for that serving. The total will be the amount of carbohydrates you will be having when you eat or drink this food or beverage. Learning Standard Serving Sizes of Food When you eat food that is not packaged or does not include "Nutrition Facts" on the label, you need to measure the servings in order to count the amount of carbohydrates.A serving of most carbohydrate-rich foods contains about 15 g of carbohydrates. The following list includes serving sizes of carbohydrate-rich foods that provide 15 g ofcarbohydrate per serving:   1 slice of bread (1 oz) or 1 six-inch tortilla.    of a hamburger bun or English muffin.  4-6 crackers.   cup unsweetened dry cereal.    cup hot cereal.   cup rice or pasta.    cup mashed potatoes or  of a large baked potato.  1 cup fresh fruit or one small piece of fruit.    cup canned or frozen fruit or fruit juice.  1 cup milk.   cup plain fat-free yogurt or yogurt sweetened with artificial sweeteners.   cup cooked dried beans or starchy vegetable, such as peas, corn, or potatoes.  Decide the number of standard-size servings that you will eat. Multiply that number of servings by 15 (the grams of carbohydrates in that serving). For example, if you eat 2 cups of strawberries, you will have eaten 2 servings and 30 g of  carbohydrates (2 servings x 15 g = 30 g). For foods such as soups and casseroles, in which more than one food is mixed in, you will need to count the carbohydrates in each food that is included. EXAMPLE OF CARBOHYDRATE COUNTING Sample Dinner  3 oz chicken breast.   cup of brown rice.   cup of corn.  1 cup milk.   1 cup   strawberries with sugar-free whipped topping.  Carbohydrate Calculation Step 1: Identify the foods that contain carbohydrates:   Rice.   Corn.   Milk.   Strawberries. Step 2:Calculate the number of servings eaten of each:   2 servings of rice.   1 serving of corn.   1 serving of milk.   1 serving of strawberries. Step 3: Multiply each of those number of servings by 15 g:   2 servings of rice x 15 g = 30 g.   1 serving of corn x 15 g = 15 g.   1 serving of milk x 15 g = 15 g.   1 serving of strawberries x 15 g = 15 g. Step 4: Add together all of the amounts to find the total grams of carbohydrates eaten: 30 g + 15 g + 15 g + 15 g = 75 g.   This information is not intended to replace advice given to you by your health care provider. Make sure you discuss any questions you have with your health care provider.   Document Released: 05/26/2005 Document Revised: 06/16/2014 Document Reviewed: 04/22/2013 Elsevier Interactive Patient Education [employer].

## 2015-07-19 ENCOUNTER — Encounter: Payer: Self-pay | Admitting: Women's Health

## 2015-07-19 LAB — URINALYSIS W MICROSCOPIC + REFLEX CULTURE
BILIRUBIN URINE: NEGATIVE
Casts: NONE SEEN [LPF]
GLUCOSE, UA: NEGATIVE
Hgb urine dipstick: NEGATIVE
Ketones, ur: NEGATIVE
Nitrite: NEGATIVE
Protein, ur: NEGATIVE
SPECIFIC GRAVITY, URINE: 1.026 (ref 1.001–1.035)
Yeast: NONE SEEN [HPF]
pH: 5.5 (ref 5.0–8.0)

## 2015-07-19 LAB — TSH: TSH: 4.62 m[IU]/L — AB

## 2015-07-19 LAB — THYROID PROFILE - CHCC
FREE THYROXINE INDEX: 2 (ref 1.4–3.8)
T3 UPTAKE: 27 % (ref 22–35)
T4, Total: 7.5 ug/dL (ref 4.5–12.0)

## 2015-07-19 LAB — HEMOGLOBIN A1C
Hgb A1c MFr Bld: 5.9 % — ABNORMAL HIGH (ref <5.7)
Mean Plasma Glucose: 123 mg/dL — ABNORMAL HIGH (ref <117)

## 2015-07-20 ENCOUNTER — Other Ambulatory Visit: Payer: Self-pay | Admitting: Women's Health

## 2015-07-20 DIAGNOSIS — E038 Other specified hypothyroidism: Secondary | ICD-10-CM

## 2015-07-20 LAB — URINE CULTURE: Colony Count: 50000

## 2015-07-20 LAB — CYTOLOGY - PAP

## 2015-07-20 MED ORDER — LEVOTHYROXINE SODIUM 50 MCG PO TABS: 50.0000 ug | ORAL_TABLET | Freq: Every day | ORAL | Status: DC

## 2015-07-20 NOTE — Progress Notes (Signed)
Telephone call reviewed TSH elevated at 5.6, T3 and T4 are normal, has had increased fatigue and weight gain despite diet and exercise changes. Will try Synthroid 50 g daily proper administration reviewed return to office in 6 weeks for TSH recheck.

## 2015-08-16 ENCOUNTER — Other Ambulatory Visit: Payer: Self-pay

## 2015-08-16 DIAGNOSIS — E038 Other specified hypothyroidism: Secondary | ICD-10-CM

## 2015-08-16 MED ORDER — LEVOTHYROXINE SODIUM 50 MCG PO TABS: 50.0000 ug | ORAL_TABLET | Freq: Every day | ORAL | Status: DC

## 2015-08-16 NOTE — Telephone Encounter (Signed)
I called and spoke with the pharmacist because I received a refill request for Levothyroxine 50 mcg tab. #60. [place] prescribed #60 and form shows that was picked up 07/20/15. I was concerned why she was needing a refill now.  Pharmacist said actually she only received #30 because ins would only pay for a month's supply at a time. She still has a refill on that #30 so she is not sure why the request was generated for #60 and faxed to [place]. She will make the corrections and provide patient #30 now.

## 2015-08-31 ENCOUNTER — Other Ambulatory Visit: Payer: 59

## 2015-08-31 DIAGNOSIS — E038 Other specified hypothyroidism: Secondary | ICD-10-CM

## 2015-08-31 LAB — TSH: TSH: 2.45 mIU/L

## 2015-09-03 ENCOUNTER — Other Ambulatory Visit: Payer: Self-pay | Admitting: Women's Health

## 2015-09-03 DIAGNOSIS — E038 Other specified hypothyroidism: Secondary | ICD-10-CM

## 2015-09-03 DIAGNOSIS — Z1329 Encounter for screening for other suspected endocrine disorder: Secondary | ICD-10-CM

## 2015-09-03 MED ORDER — LEVOTHYROXINE SODIUM 50 MCG PO TABS
50.0000 ug | ORAL_TABLET | Freq: Every day | ORAL | Status: DC
Start: 2015-09-03 — End: 2015-09-11

## 2015-09-11 ENCOUNTER — Other Ambulatory Visit: Payer: Self-pay | Admitting: Women's Health

## 2015-09-11 ENCOUNTER — Other Ambulatory Visit: Payer: Self-pay

## 2015-09-11 DIAGNOSIS — E038 Other specified hypothyroidism: Secondary | ICD-10-CM

## 2015-09-11 MED ORDER — LEVOTHYROXINE SODIUM 50 MCG PO TABS: 50.0000 ug | ORAL_TABLET | Freq: Every day | ORAL | Status: DC

## 2015-09-12 ENCOUNTER — Other Ambulatory Visit: Payer: Self-pay | Admitting: Women's Health

## 2015-09-12 DIAGNOSIS — E038 Other specified hypothyroidism: Secondary | ICD-10-CM

## 2015-09-12 MED ORDER — LEVOTHYROXINE SODIUM 50 MCG PO TABS
50.0000 ug | ORAL_TABLET | Freq: Every day | ORAL | Status: DC
Start: 2015-09-12 — End: 2019-10-26

## 2016-10-22 ENCOUNTER — Encounter: Payer: Self-pay | Admitting: Gynecology

## 2017-04-16 IMAGING — MG MM DIGITAL SCREENING BILAT W/ CAD
4 series · 4 of 4 positions shown · non-contrast
Comparison: Previous exam(s).

CLINICAL DATA: Screening.

EXAM:
DIGITAL SCREENING BILATERAL MAMMOGRAM WITH CAD

[R CC]
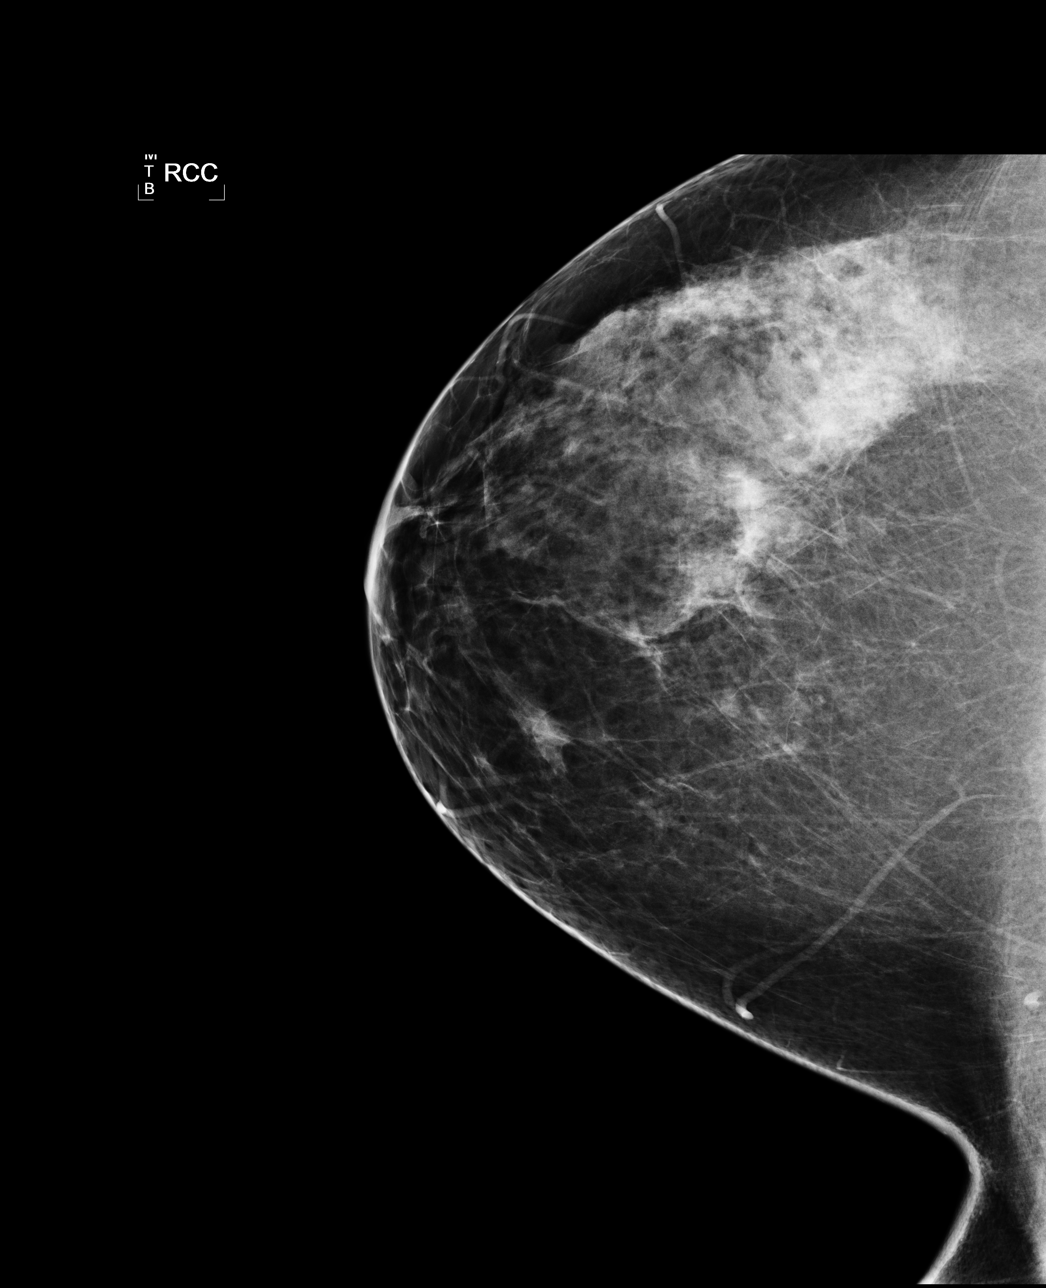

[L CC]
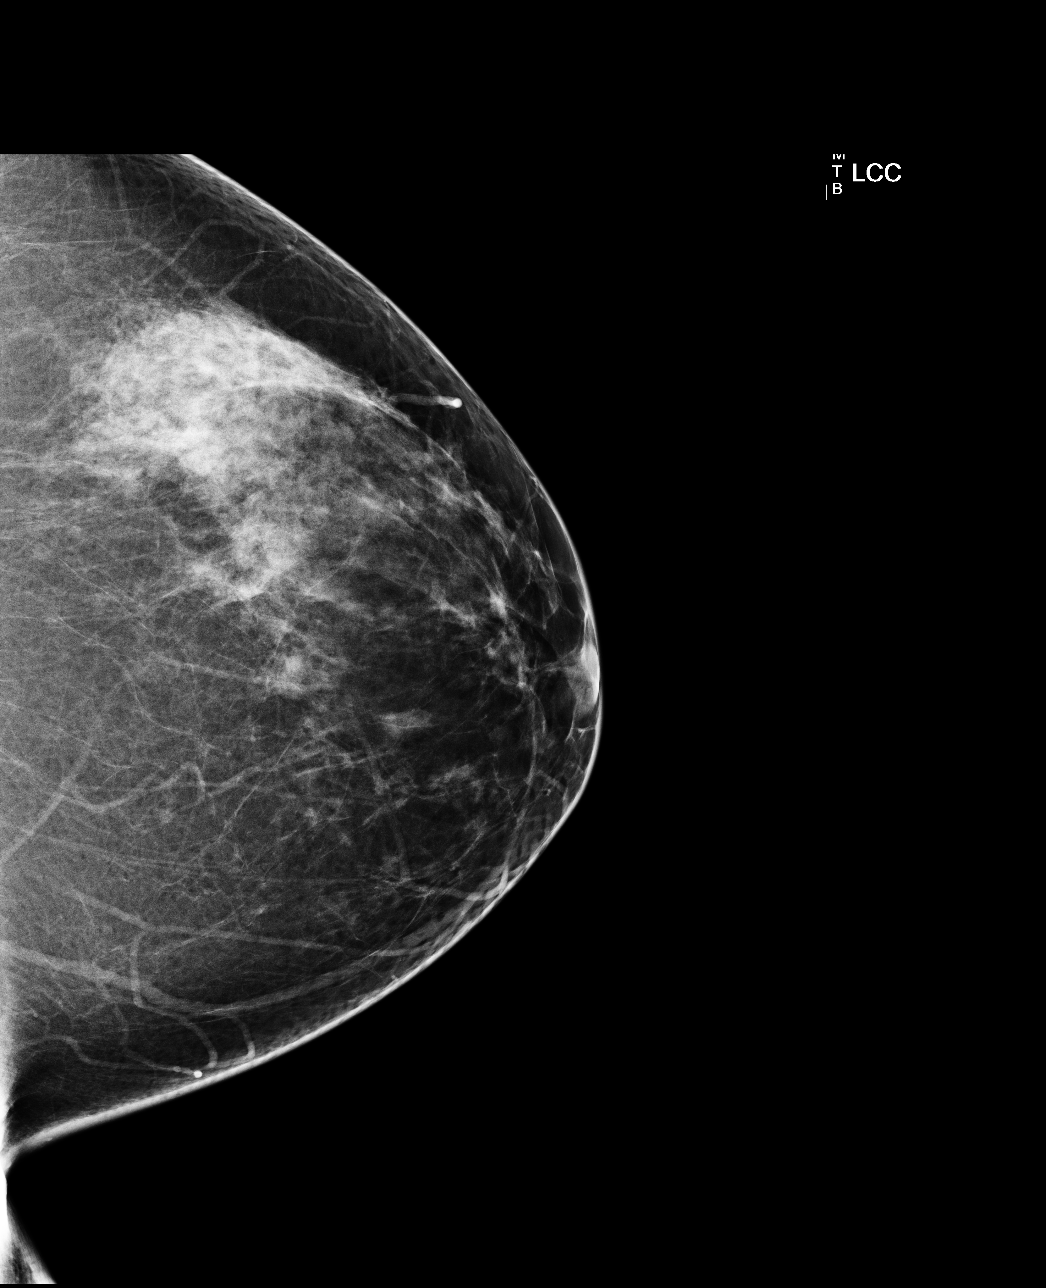

[L MLO]
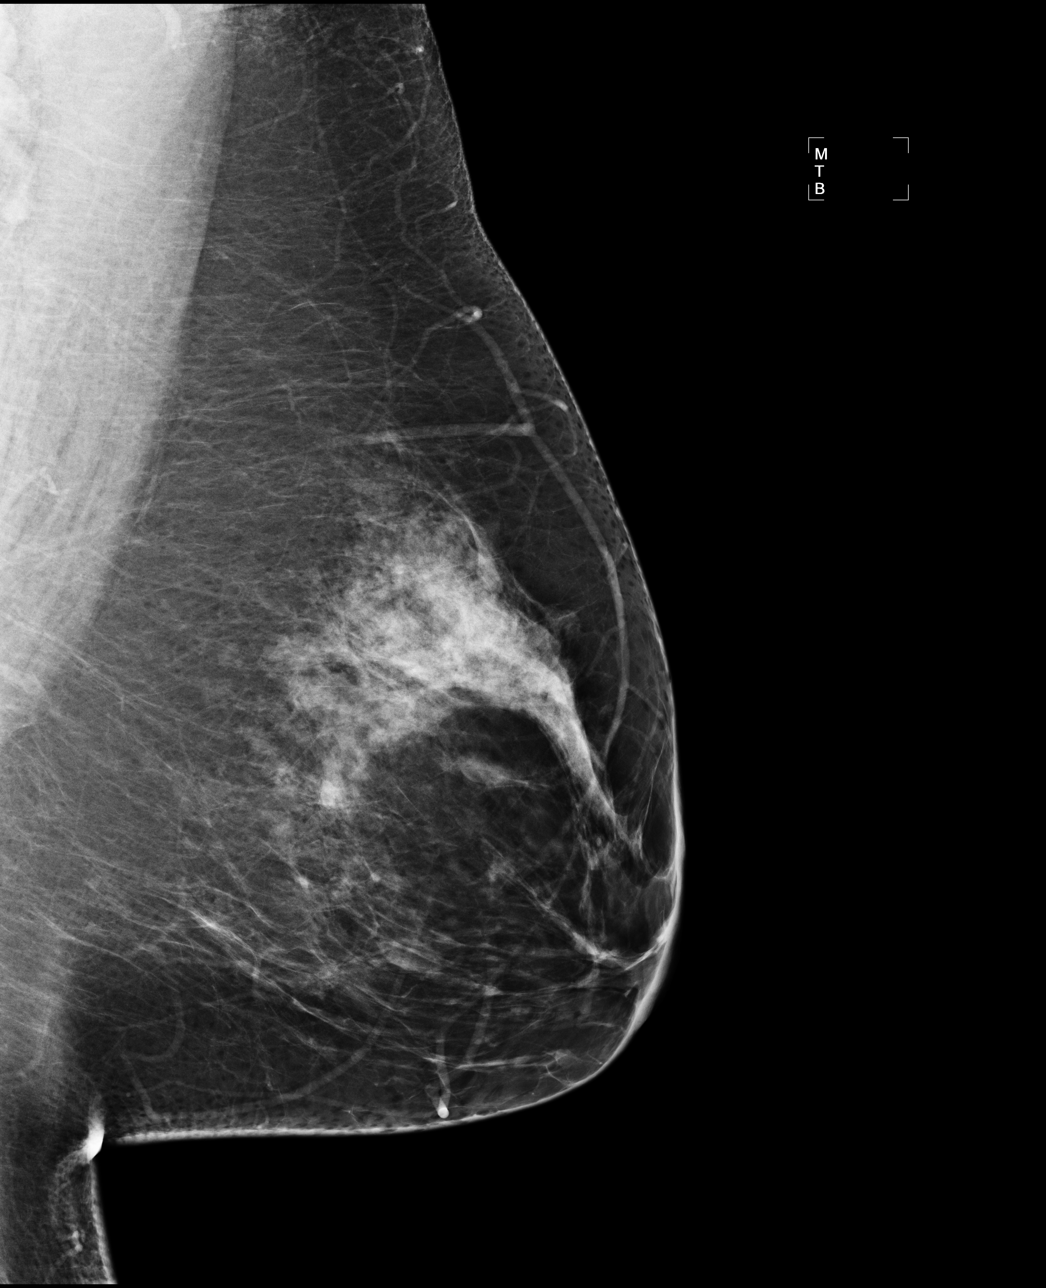

[R MLO]
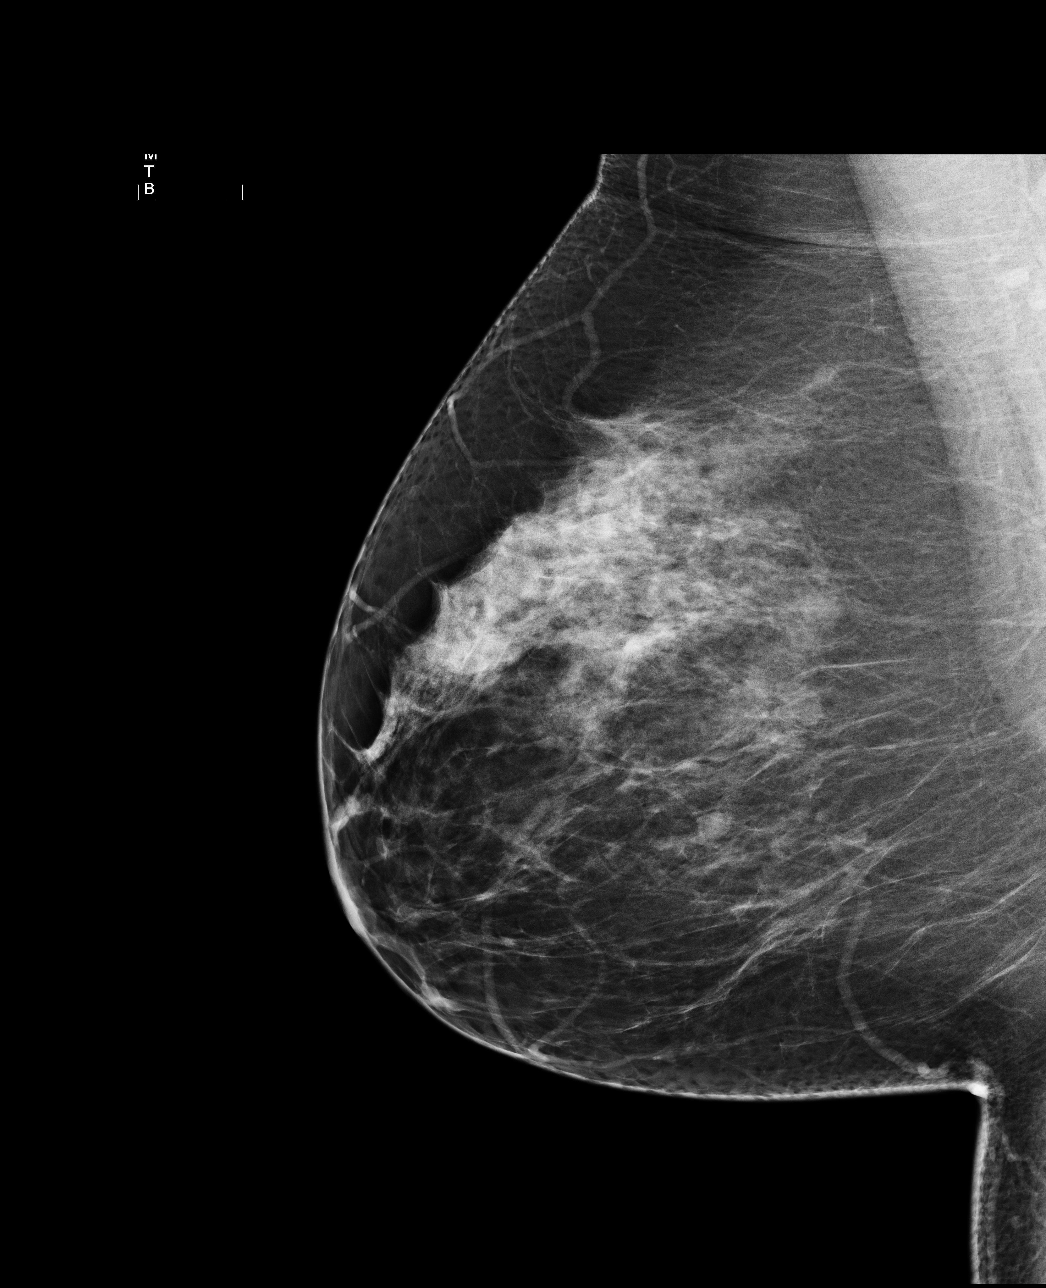

[4 of 4 positions shown; findings below may reference images not displayed]

ACR Breast Density Category c: The breast tissue is heterogeneously
dense, which may obscure small masses.
FINDINGS: There are no findings suspicious for malignancy. Images were
processed with CAD.
IMPRESSION: No mammographic evidence of malignancy. A result letter of this
screening mammogram will be mailed directly to the patient.

RECOMMENDATION:
Screening mammogram in one year. (Code:[id number])

BI-RADS CATEGORY  1: Negative.

## 2018-09-01 ENCOUNTER — Ambulatory Visit: Payer: 59 | Admitting: Women's Health

## 2018-10-13 ENCOUNTER — Ambulatory Visit: Payer: 59 | Admitting: Women's Health

## 2018-10-19 ENCOUNTER — Other Ambulatory Visit: Payer: Self-pay

## 2018-10-20 ENCOUNTER — Ambulatory Visit: Payer: 59 | Admitting: Women's Health

## 2018-10-20 ENCOUNTER — Encounter: Payer: Self-pay | Admitting: Women's Health

## 2018-10-20 VITALS — BP 138/82 | Ht 63.0 in | Wt 184.0 lb

## 2018-10-20 DIAGNOSIS — Z01419 Encounter for gynecological examination (general) (routine) without abnormal findings: Secondary | ICD-10-CM | POA: Diagnosis not present

## 2018-10-20 DIAGNOSIS — Z1151 Encounter for screening for human papillomavirus (HPV): Secondary | ICD-10-CM

## 2018-10-20 DIAGNOSIS — E039 Hypothyroidism, unspecified: Secondary | ICD-10-CM | POA: Insufficient documentation

## 2018-10-20 NOTE — Progress Notes (Signed)
TALEIGH GERO 1965/11/04 [id number]    History:    Presents for annual exam.  Has had annual exams at primary care last here 2017.  Light mostly monthly cycles/condoms.  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  Has not had a screening colonoscopy.  Primary care manages hypertension, hypothyroidism, anxiety/depression.  Smokes less than 1/2 pack daily.  2017 cholecystectomy.  Past medical history, past surgical history, family history and social history were all reviewed and documented in the EPIC chart.  Works for a [employer], [employer].  Tommi Rumps almost 17 doing well in school- early college.  Father stroke  ROS:  A ROS was performed and pertinent positives and negatives are included.  Exam:  Vitals:   10/20/18 1307  BP: 138/82  Weight: 184 lb (83.5 kg)  Height: 5\' 3"  (1.6 m)   Body mass index is 32.59 kg/m.   General appearance:  Normal Thyroid:  Symmetrical, normal in size, without palpable masses or nodularity. Respiratory  Auscultation:  Clear without wheezing or rhonchi Cardiovascular  Auscultation:  Regular rate, without rubs, murmurs or gallops  Edema/varicosities:  Not grossly evident Abdominal  Soft,nontender, without masses, guarding or rebound.  Liver/spleen:  No organomegaly noted  Hernia:  None appreciated  Skin  Inspection:  Grossly normal   Breasts: Examined lying and sitting.     Right: Without masses, retractions, discharge or axillary adenopathy.     Left: Without masses, retractions, discharge or axillary adenopathy. Gentitourinary   Inguinal/mons:  Normal without inguinal adenopathy  External genitalia:  Normal  BUS/Urethra/Skene's glands:  Normal  Vagina:  Normal  Cervix:  Normal  Uterus:   normal in size, shape and contour.  Midline and mobile  Adnexa/parametria:     Rt: Without masses or tenderness.   Lt: Without masses or tenderness.  Anus and perineum: Normal  Digital rectal exam: Normal sphincter tone without  palpated masses or tenderness  Assessment/Plan:  53 y.o. MWF G3, P1 for annual exam no GYN complaints.  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  SBEs, annual screening mammogram overdue instructed to schedule.  Screening colonoscopy, [facility] information given instructed to schedule.  Aware of importance of increasing regular cardio type exercise and decreasing calorie/carbs.  Pap with HR HPV typing, new screening guidelines reviewed.    Huel Cote [facility], 3:03 PM 10/20/2018

## 2018-10-20 NOTE — Patient Instructions (Addendum)
[facility]  [phone]   Dr Carlean Purl  Vit D3 2000 iu daily  Human Papillomavirus Quadrivalent Vaccine suspension for injection What is this medicine? HUMAN PAPILLOMAVIRUS VACCINE (HYOO muhn pap uh LOH muh vahy ruhs vak SEEN) is a vaccine. It is used to prevent infections of four types of the human papillomavirus. In women, the vaccine may lower your risk of getting cervical, vaginal, vulvar, or anal cancer and genital warts. In men, the vaccine may lower your risk of getting genital warts and anal cancer. You cannot get these diseases from the vaccine. This vaccine does not treat these diseases. This medicine may be used for other purposes; ask your health care provider or pharmacist if you have questions. COMMON BRAND NAME(S): Gardasil What should I tell my health care provider before I take this medicine? They need to know if you have any of these conditions: -fever or infection -hemophilia -HIV infection or AIDS -immune system problems -low platelet count -an unusual reaction to Human Papillomavirus Vaccine, yeast, other medicines, foods, dyes, or preservatives -pregnant or trying to get pregnant -breast-feeding How should I use this medicine? This vaccine is for injection in a muscle on your upper arm or thigh. It is given by a health care professional. Kelli Adams will be observed for 15 minutes after each dose. Sometimes, fainting happens after the vaccine is given. You may be asked to sit or lie down during the 15 minutes. Three doses are given. The second dose is given 2 months after the first dose. The last dose is given 4 months after the second dose. A copy of a Vaccine Information Statement will be given before each vaccination. Read this sheet carefully each time. The sheet may change frequently. Talk to your pediatrician regarding the use of this medicine in children. While this drug may be prescribed for children as Kelli Adams as 52 years of age for selected conditions, precautions do apply.  Overdosage: If you think you have taken too much of this medicine contact a poison control center or emergency room at once. NOTE: This medicine is only for you. Do not share this medicine with others. What if I miss a dose? All 3 doses of the vaccine should be given within 6 months. Remember to keep appointments for follow-up doses. Your health care provider will tell you when to return for the next vaccine. Ask your health care professional for advice if you are unable to keep an appointment or miss a scheduled dose. What may interact with this medicine? -other vaccines This list may not describe all possible interactions. Give your health care provider a list of all the medicines, herbs, non-prescription drugs, or dietary supplements you use. Also tell them if you smoke, drink alcohol, or use illegal drugs. Some items may interact with your medicine. What should I watch for while using this medicine? This vaccine may not fully protect everyone. Continue to have regular pelvic exams and cervical or anal cancer screenings as directed by your doctor. The Human Papillomavirus is a sexually transmitted disease. It can be passed by any kind of sexual activity that involves genital contact. The vaccine works best when given before you have any contact with the virus. Many people who have the virus do not have any signs or symptoms. Tell your doctor or health care professional if you have any reaction or unusual symptom after getting the vaccine. What side effects may I notice from receiving this medicine? Side effects that you should report to your doctor or health  care professional as soon as possible: -allergic reactions like skin rash, itching or hives, swelling of the face, lips, or tongue -breathing problems -feeling faint or lightheaded, falls Side effects that usually do not require medical attention (report to your doctor or health care professional if they continue or are bothersome): -cough  -dizziness -fever -headache -nausea -redness, warmth, swelling, pain, or itching at site where injected This list may not describe all possible side effects. Call your doctor for medical advice about side effects. You may report side effects to FDA at 1-800-FDA-1088. Where should I keep my medicine? This drug is given in a hospital or clinic and will not be stored at home. NOTE: This sheet is a summary. It may not cover all possible information. If you have questions about this medicine, talk to your doctor, pharmacist, or health care provider.  2019 Elsevier/Gold Standard (2013-07-18 13:14:33)  Coping with Quitting Smoking  Quitting smoking is a physical and mental challenge. You will face cravings, withdrawal symptoms, and temptation. Before quitting, work with your health care provider to make a plan that can help you cope. Preparation can help you quit and keep you from giving in. How can I cope with cravings? Cravings usually last for 5-10 minutes. If you get through it, the craving will pass. Consider taking the following actions to help you cope with cravings:  Keep your mouth busy: ? Chew sugar-free gum. ? Suck on hard candies or a straw. ? Brush your teeth.  Keep your hands and body busy: ? Immediately change to a different activity when you feel a craving. ? Squeeze or play with a ball. ? Do an activity or a hobby, like making bead jewelry, practicing needlepoint, or working with wood. ? Mix up your normal routine. ? Take a short exercise break. Go for a quick walk or run up and down stairs. ? Spend time in public places where smoking is not allowed.  Focus on doing something kind or helpful for someone else.  Call a friend or family member to talk during a craving.  Join a support group.  Call a quit line, such as 1-800-QUIT-NOW.  Talk with your health care provider about medicines that might help you cope with cravings and make quitting easier for you. How can I  deal with withdrawal symptoms? Your body may experience negative effects as it tries to get used to not having nicotine in the system. These effects are called withdrawal symptoms. They may include:  Feeling hungrier than normal.  Trouble concentrating.  Irritability.  Trouble sleeping.  Feeling depressed.  Restlessness and agitation.  Craving a cigarette. To manage withdrawal symptoms:  Avoid places, people, and activities that trigger your cravings.  Remember why you want to quit.  Get plenty of sleep.  Avoid coffee and other caffeinated drinks. These may worsen some of your symptoms. How can I handle social situations? Social situations can be difficult when you are quitting smoking, especially in the first few weeks. To manage this, you can:  Avoid parties, bars, and other social situations where people might be smoking.  Avoid alcohol.  Leave right away if you have the urge to smoke.  Explain to your family and friends that you are quitting smoking. Ask for understanding and support.  Plan activities with friends or family where smoking is not an option. What are some ways I can cope with stress? Wanting to smoke may cause stress, and stress can make you want to smoke. Find ways to manage  your stress. Relaxation techniques can help. For example:  Breathe slowly and deeply, in through your nose and out through your mouth.  Listen to soothing, relaxing music.  Talk with a family member or friend about your stress.  Light a candle.  Soak in a bath or take a shower.  Think about a peaceful place. What are some ways I can prevent weight gain? Be aware that many people gain weight after they quit smoking. However, not everyone does. To keep from gaining weight, have a plan in place before you quit and stick to the plan after you quit. Your plan should include:  Having healthy snacks. When you have a craving, it may help to: ? Eat plain popcorn, crunchy carrots,  celery, or other cut vegetables. ? Chew sugar-free gum.  Changing how you eat: ? Eat small portion sizes at meals. ? Eat 4-6 small meals throughout the day instead of 1-2 large meals a day. ? Be mindful when you eat. Do not watch television or do other things that might distract you as you eat.  Exercising regularly: ? Make time to exercise each day. If you do not have time for a long workout, do short bouts of exercise for 5-10 minutes several times a day. ? Do some form of strengthening exercise, like weight lifting, and some form of aerobic exercise, like running or swimming.  Drinking plenty of water or other low-calorie or no-calorie drinks. Drink 6-8 glasses of water daily, or as much as instructed by your health care provider. Summary  Quitting smoking is a physical and mental challenge. You will face cravings, withdrawal symptoms, and temptation to smoke again. Preparation can help you as you go through these challenges.  You can cope with cravings by keeping your mouth busy (such as by chewing gum), keeping your body and hands busy, and making calls to family, friends, or a helpline for people who want to quit smoking.  You can cope with withdrawal symptoms by avoiding places where people smoke, avoiding drinks with caffeine, and getting plenty of rest.  Ask your health care provider about the different ways to prevent weight gain, avoid stress, and handle social situations. This information is not intended to replace advice given to you by your health care provider. Make sure you discuss any questions you have with your health care provider. Document Released: 05/23/2016 Document Revised: 05/23/2016 Document Reviewed: 05/23/2016 Elsevier Interactive Patient Education  2019 [place].   Maintenance for Postmenopausal Women Menopause is a normal process in which your reproductive ability comes to an end. This process happens gradually over a span of months to years, usually  between the ages of 67 and 67. Menopause is complete when you have missed 12 consecutive menstrual periods. It is important to talk with your health care provider about some of the most common conditions that affect postmenopausal women, such as heart disease, cancer, and bone loss (osteoporosis). Adopting a healthy lifestyle and getting preventive care can help to promote your health and wellness. Those actions can also lower your chances of developing some of these common conditions. What should I know about menopause? During menopause, you may experience a number of symptoms, such as:  Moderate-to-severe hot flashes.  Night sweats.  Decrease in sex drive.  Mood swings.  Headaches.  Tiredness.  Irritability.  Memory problems.  Insomnia. Choosing to treat or not to treat menopausal changes is an individual decision that you make with your health care provider. What should I know about  hormone replacement therapy and supplements? Hormone therapy products are effective for treating symptoms that are associated with menopause, such as hot flashes and night sweats. Hormone replacement carries certain risks, especially as you become older. If you are thinking about using estrogen or estrogen with progestin treatments, discuss the benefits and risks with your health care provider. What should I know about heart disease and stroke? Heart disease, heart attack, and stroke become more likely as you age. This may be due, in part, to the hormonal changes that your body experiences during menopause. These can affect how your body processes dietary fats, triglycerides, and cholesterol. Heart attack and stroke are both medical emergencies. There are many things that you can do to help prevent heart disease and stroke:  Have your blood pressure checked at least every 1-2 years. High blood pressure causes heart disease and increases the risk of stroke.  If you are 10-15 years old, ask your health  care provider if you should take aspirin to prevent a heart attack or a stroke.  Do not use any tobacco products, including cigarettes, chewing tobacco, or electronic cigarettes. If you need help quitting, ask your health care provider.  It is important to eat a healthy diet and maintain a healthy weight. ? Be sure to include plenty of vegetables, fruits, low-fat dairy products, and lean protein. ? Avoid eating foods that are high in solid fats, added sugars, or salt (sodium).  Get regular exercise. This is one of the most important things that you can do for your health. ? Try to exercise for at least 150 minutes each week. The type of exercise that you do should increase your heart rate and make you sweat. This is known as moderate-intensity exercise. ? Try to do strengthening exercises at least twice each week. Do these in addition to the moderate-intensity exercise.  Know your numbers.Ask your health care provider to check your cholesterol and your blood glucose. Continue to have your blood tested as directed by your health care provider.  What should I know about cancer screening? There are several types of cancer. Take the following steps to reduce your risk and to catch any cancer development as early as possible. Breast Cancer  Practice breast self-awareness. ? This means understanding how your breasts normally appear and feel. ? It also means doing regular breast self-exams. Let your health care provider know about any changes, no matter how small.  If you are 52 or older, have a clinician do a breast exam (clinical breast exam or CBE) every year. Depending on your age, family history, and medical history, it may be recommended that you also have a yearly breast X-ray (mammogram).  If you have a family history of breast cancer, talk with your health care provider about genetic screening.  If you are at high risk for breast cancer, talk with your health care provider about having  an MRI and a mammogram every year.  Breast cancer (BRCA) gene test is recommended for women who have family members with BRCA-related cancers. Results of the assessment will determine the need for genetic counseling and BRCA1 and for BRCA2 testing. BRCA-related cancers include these types: ? Breast. This occurs in males or females. ? Ovarian. ? Tubal. This may also be called fallopian tube cancer. ? Cancer of the abdominal or pelvic lining (peritoneal cancer). ? Prostate. ? Pancreatic. Cervical, Uterine, and Ovarian Cancer Your health care provider may recommend that you be screened regularly for cancer of the pelvic organs. These  include your ovaries, uterus, and vagina. This screening involves a pelvic exam, which includes checking for microscopic changes to the surface of your cervix (Pap test).  For women ages 21-65, health care providers may recommend a pelvic exam and a Pap test every three years. For women ages 78-65, they may recommend the Pap test and pelvic exam, combined with testing for human papilloma virus (HPV), every five years. Some types of HPV increase your risk of cervical cancer. Testing for HPV may also be done on women of any age who have unclear Pap test results.  Other health care providers may not recommend any screening for nonpregnant women who are considered low risk for pelvic cancer and have no symptoms. Ask your health care provider if a screening pelvic exam is right for you.  If you have had past treatment for cervical cancer or a condition that could lead to cancer, you need Pap tests and screening for cancer for at least 20 years after your treatment. If Pap tests have been discontinued for you, your risk factors (such as having a new sexual partner) need to be reassessed to determine if you should start having screenings again. Some women have medical problems that increase the chance of getting cervical cancer. In these cases, your health care provider may  recommend that you have screening and Pap tests more often.  If you have a family history of uterine cancer or ovarian cancer, talk with your health care provider about genetic screening.  If you have vaginal bleeding after reaching menopause, tell your health care provider.  There are currently no reliable tests available to screen for ovarian cancer. Lung Cancer Lung cancer screening is recommended for adults 22-36 years old who are at high risk for lung cancer because of a history of smoking. A yearly low-dose CT scan of the lungs is recommended if you:  Currently smoke.  Have a history of at least 30 pack-years of smoking and you currently smoke or have quit within the past 15 years. A pack-year is smoking an average of one pack of cigarettes per day for one year. Yearly screening should:  Continue until it has been 15 years since you quit.  Stop if you develop a health problem that would prevent you from having lung cancer treatment. Colorectal Cancer  This type of cancer can be detected and can often be prevented.  Routine colorectal cancer screening usually begins at age 32 and continues through age 53.  If you have risk factors for colon cancer, your health care provider may recommend that you be screened at an earlier age.  If you have a family history of colorectal cancer, talk with your health care provider about genetic screening.  Your health care provider may also recommend using home test kits to check for hidden blood in your stool.  A small camera at the end of a tube can be used to examine your colon directly (sigmoidoscopy or colonoscopy). This is done to check for the earliest forms of colorectal cancer.  Direct examination of the colon should be repeated every 5-10 years until age 8. However, if early forms of precancerous polyps or small growths are found or if you have a family history or genetic risk for colorectal cancer, you may need to be screened more  often. Skin Cancer  Check your skin from head to toe regularly.  Monitor any moles. Be sure to tell your health care provider: ? About any new moles or changes in moles,  especially if there is a change in a mole's shape or color. ? If you have a mole that is larger than the size of a pencil eraser.  If any of your family members has a history of skin cancer, especially at a Maeve Debord age, talk with your health care provider about genetic screening.  Always use sunscreen. Apply sunscreen liberally and repeatedly throughout the day.  Whenever you are outside, protect yourself by wearing long sleeves, pants, a wide-brimmed hat, and sunglasses. What should I know about osteoporosis? Osteoporosis is a condition in which bone destruction happens more quickly than new bone creation. After menopause, you may be at an increased risk for osteoporosis. To help prevent osteoporosis or the bone fractures that can happen because of osteoporosis, the following is recommended:  If you are 74-4 years old, get at least 1,000 mg of calcium and at least 600 mg of vitamin D per day.  If you are older than age 16 but younger than age 33, get at least 1,200 mg of calcium and at least 600 mg of vitamin D per day.  If you are older than age 11, get at least 1,200 mg of calcium and at least 800 mg of vitamin D per day. Smoking and excessive alcohol intake increase the risk of osteoporosis. Eat foods that are rich in calcium and vitamin D, and do weight-bearing exercises several times each week as directed by your health care provider. What should I know about how menopause affects my mental health? Depression may occur at any age, but it is more common as you become older. Common symptoms of depression include:  Low or sad mood.  Changes in sleep patterns.  Changes in appetite or eating patterns.  Feeling an overall lack of motivation or enjoyment of activities that you previously enjoyed.  Frequent crying  spells. Talk with your health care provider if you think that you are experiencing depression. What should I know about immunizations? It is important that you get and maintain your immunizations. These include:  Tetanus, diphtheria, and pertussis (Tdap) booster vaccine.  Influenza every year before the flu season begins.  Pneumonia vaccine.  Shingles vaccine. Your health care provider may also recommend other immunizations. This information is not intended to replace advice given to you by your health care provider. Make sure you discuss any questions you have with your health care provider. Document Released: 07/18/2005 Document Revised: 12/14/2015 Document Reviewed: 02/27/2015 Elsevier Interactive Patient Education  2019 [employer].

## 2018-10-21 LAB — PAP, TP IMAGING W/ HPV RNA, RFLX HPV TYPE 16,18/45: HPV DNA High Risk: NOT DETECTED

## 2019-10-24 ENCOUNTER — Other Ambulatory Visit: Payer: Self-pay

## 2019-10-24 ENCOUNTER — Encounter: Payer: Self-pay | Admitting: Nurse Practitioner

## 2019-10-24 ENCOUNTER — Ambulatory Visit (INDEPENDENT_AMBULATORY_CARE_PROVIDER_SITE_OTHER): Payer: 59 | Admitting: Nurse Practitioner

## 2019-10-24 VITALS — BP 140/80 | Ht 63.0 in | Wt 189.0 lb

## 2019-10-24 DIAGNOSIS — Z01419 Encounter for gynecological examination (general) (routine) without abnormal findings: Secondary | ICD-10-CM

## 2019-10-24 DIAGNOSIS — E039 Hypothyroidism, unspecified: Secondary | ICD-10-CM

## 2019-10-24 NOTE — Patient Instructions (Signed)
Health Maintenance, Female Adopting a healthy lifestyle and getting preventive care are important in promoting health and wellness. Ask your health care provider about:  The right schedule for you to have regular tests and exams.  Things you can do on your own to prevent diseases and keep yourself healthy. What should I know about diet, weight, and exercise? Eat a healthy diet   Eat a diet that includes plenty of vegetables, fruits, low-fat dairy products, and lean protein.  Do not eat a lot of foods that are high in solid fats, added sugars, or sodium. Maintain a healthy weight Body mass index (BMI) is used to identify weight problems. It estimates body fat based on height and weight. Your health care provider can help determine your BMI and help you achieve or maintain a healthy weight. Get regular exercise Get regular exercise. This is one of the most important things you can do for your health. Most adults should:  Exercise for at least 150 minutes each week. The exercise should increase your heart rate and make you sweat (moderate-intensity exercise).  Do strengthening exercises at least twice a week. This is in addition to the moderate-intensity exercise.  Spend less time sitting. Even light physical activity can be beneficial. Watch cholesterol and blood lipids Have your blood tested for lipids and cholesterol at 53 years of age, then have this test every 5 years. Have your cholesterol levels checked more often if:  Your lipid or cholesterol levels are high.  You are older than 53 years of age.  You are at high risk for heart disease. What should I know about cancer screening? Depending on your health history and family history, you may need to have cancer screening at various ages. This may include screening for:  Breast cancer.  Cervical cancer.  Colorectal cancer.  Skin cancer.  Lung cancer. What should I know about heart disease, diabetes, and high blood  pressure? Blood pressure and heart disease  High blood pressure causes heart disease and increases the risk of stroke. This is more likely to develop in people who have high blood pressure readings, are of African descent, or are overweight.  Have your blood pressure checked: ? Every 3-5 years if you are 18-39 years of age. ? Every year if you are 40 years old or older. Diabetes Have regular diabetes screenings. This checks your fasting blood sugar level. Have the screening done:  Once every three years after age 40 if you are at a normal weight and have a low risk for diabetes.  More often and at a younger age if you are overweight or have a high risk for diabetes. What should I know about preventing infection? Hepatitis B If you have a higher risk for hepatitis B, you should be screened for this virus. Talk with your health care provider to find out if you are at risk for hepatitis B infection. Hepatitis C Testing is recommended for:  Everyone born from 1945 through 1965.  Anyone with known risk factors for hepatitis C. Sexually transmitted infections (STIs)  Get screened for STIs, including gonorrhea and chlamydia, if: ? You are sexually active and are younger than 53 years of age. ? You are older than 53 years of age and your health care provider tells you that you are at risk for this type of infection. ? Your sexual activity has changed since you were last screened, and you are at increased risk for chlamydia or gonorrhea. Ask your health care provider if   you are at risk.  Ask your health care provider about whether you are at high risk for HIV. Your health care provider may recommend a prescription medicine to help prevent HIV infection. If you choose to take medicine to prevent HIV, you should first get tested for HIV. You should then be tested every 3 months for as long as you are taking the medicine. Pregnancy  If you are about to stop having your period (premenopausal) and  you may become pregnant, seek counseling before you get pregnant.  Take 400 to 800 micrograms (mcg) of folic acid every day if you become pregnant.  Ask for birth control (contraception) if you want to prevent pregnancy. Osteoporosis and menopause Osteoporosis is a disease in which the bones lose minerals and strength with aging. This can result in bone fractures. If you are 65 years old or older, or if you are at risk for osteoporosis and fractures, ask your health care provider if you should:  Be screened for bone loss.  Take a calcium or vitamin D supplement to lower your risk of fractures.  Be given hormone replacement therapy (HRT) to treat symptoms of menopause. Follow these instructions at home: Lifestyle  Do not use any products that contain nicotine or tobacco, such as cigarettes, e-cigarettes, and chewing tobacco. If you need help quitting, ask your health care provider.  Do not use street drugs.  Do not share needles.  Ask your health care provider for help if you need support or information about quitting drugs. Alcohol use  Do not drink alcohol if: ? Your health care provider tells you not to drink. ? You are pregnant, may be pregnant, or are planning to become pregnant.  If you drink alcohol: ? Limit how much you use to 0-1 drink a day. ? Limit intake if you are breastfeeding.  Be aware of how much alcohol is in your drink. In the [place] one drink equals one 12 oz bottle of beer (355 mL), one 5 oz glass of wine (148 mL), or one 1 oz glass of hard liquor (44 mL). General instructions  Schedule regular health, dental, and eye exams.  Stay current with your vaccines.  Tell your health care provider if: ? You often feel depressed. ? You have ever been abused or do not feel safe at home. Summary  Adopting a healthy lifestyle and getting preventive care are important in promoting health and wellness.  Follow your health care provider's instructions about healthy  diet, exercising, and getting tested or screened for diseases.  Follow your health care provider's instructions on monitoring your cholesterol and blood pressure. This information is not intended to replace advice given to you by your health care provider. Make sure you discuss any questions you have with your health care provider. Document Revised: 05/19/2018 Document Reviewed: 05/19/2018 Elsevier Patient Education  2020 Elsevier Inc.  

## 2019-10-24 NOTE — Progress Notes (Signed)
   ELLERIE PFAU May 17, 1966 [id number]   History:  54 y.o. MWF G3 P1 presents for annual exam without GYN complaints.  Having irregular cycles every 1 to 3 months, denies menopausal symptoms.  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  Primary care manages hypertension, hypothyroidism, anxiety/depression.  Labs done January 2021, TSH not done and patient requesting this.  Saw GI 10/14/2019, waiting to hear back from them to schedule colonoscopy.  Sister diagnosed with breast cancer recently.  Gynecologic History Patient's last menstrual period was 09/21/2019. Period Pattern: (!) Irregular Menstrual Flow: Moderate Menstrual Control: Maxi pad Dysmenorrhea: (!) Mild Dysmenorrhea Symptoms: Cramping Contraception: none Last Pap: 10/20/2018. Results were: normal Last mammogram: 01/05/2019. Results were: normal Colonoscopy: 03/22/2007. Results: Normal  Past medical history, past surgical history, family history and social history were all reviewed and documented in the EPIC chart.  Works for [employer].  ROS:  A ROS was performed and pertinent positives and negatives are included.  Exam:  Vitals:   10/24/19 1536  BP: 140/80  Weight: 189 lb (85.7 kg)  Height: 5\' 3"  (1.6 m)   Body mass index is 33.48 kg/m.  General appearance:  Normal Thyroid:  Symmetrical, normal in size, without palpable masses or nodularity. Respiratory  Auscultation:  Clear without wheezing or rhonchi Cardiovascular  Auscultation:  Regular rate, without rubs, murmurs or gallops  Edema/varicosities:  Not grossly evident Abdominal  Soft,nontender, without masses, guarding or rebound.  Liver/spleen:  No organomegaly noted  Hernia:  None appreciated  Skin  Inspection:  Grossly normal   Breasts: Examined lying and sitting.   Right: Without masses, retractions, discharge or axillary adenopathy.   Left: Without masses, retractions, discharge or axillary  adenopathy. Gentitourinary   Inguinal/mons:  Normal without inguinal adenopathy  External genitalia:  Normal  BUS/Urethra/Skene's glands:  Normal  Vagina:  Normal  Cervix:  Normal  Uterus:  Normal in size, shape and contour.  Midline and mobile  Adnexa/parametria:     Rt: Without masses or tenderness.   Lt: Without masses or tenderness.  Anus and perineum: Normal  Digital rectal exam: Normal sphincter tone without palpated masses or tenderness  Assessment/Plan:  54 y.o.  for annual exam.   Well female exam with routine gynecological exam - Education provided on SBEs, importance of preventative screenings, current guidelines, high calcium diet, regular exercise, and multivitamin daily.   Hypothyroidism, unspecified type - Plan: TSH  Follow up in 1 year for annual     [place], 3:41 PM 10/24/2019

## 2019-10-25 LAB — TSH: TSH: 6.09 mIU/L — ABNORMAL HIGH

## 2019-10-26 ENCOUNTER — Other Ambulatory Visit: Payer: Self-pay | Admitting: Nurse Practitioner

## 2019-10-26 ENCOUNTER — Other Ambulatory Visit: Payer: Self-pay

## 2019-10-26 ENCOUNTER — Telehealth: Payer: Self-pay

## 2019-10-26 DIAGNOSIS — E039 Hypothyroidism, unspecified: Secondary | ICD-10-CM

## 2019-10-26 DIAGNOSIS — R7989 Other specified abnormal findings of blood chemistry: Secondary | ICD-10-CM

## 2019-10-26 MED ORDER — LEVOTHYROXINE SODIUM 112 MCG PO TABS: 112.0000 ug | ORAL_TABLET | Freq: Every day | ORAL | 1 refills | Status: AC

## 2019-10-26 NOTE — Telephone Encounter (Signed)
Spoke with patient and informed her regarding increase in dose.  Recheck in 6 weeks. Lab order placed and she knows to call for lab appt.

## 2019-10-26 NOTE — Telephone Encounter (Signed)
-----   Message from Tamela Gammon, NP sent at 10/25/2019  8:37 AM EDT ----- Please call patient  to let her know her TSH was elevated indicating under activity. Make sure she is taking it every morning 30 minutes before eating/drinking anything. If she is taking properly, I want to increase her dose to 2mcg daily and recheck in 6 weeks. Please let me know! Thank you

## 2019-10-26 NOTE — Telephone Encounter (Signed)
I spoke with patient yesterday and advised her of your recommendation. She called back this morning to confirm that she is actually taking 100 mcg daily  Currently (and taking it correctly) as her PCP has been following her thyroid for awhile and adjusting medication.  I get the feeling that she would like you to follow it now.  Please advise.

## 2019-10-26 NOTE — Progress Notes (Signed)
Elevated TSH. Patient taking 100 mcg daily now and reports taking it as prescribed. Will increase to 112 mcg daily and recheck TSH in 6 weeks.

## 2019-10-26 NOTE — Telephone Encounter (Signed)
I would be happy to. I will send in a new prescription of Synthroid 175mcg daily and recheck her TSH in 6 weeks.

## 2019-12-17 ENCOUNTER — Other Ambulatory Visit: Payer: Self-pay | Admitting: Nurse Practitioner

## 2019-12-17 DIAGNOSIS — E039 Hypothyroidism, unspecified: Secondary | ICD-10-CM

## 2021-06-05 ENCOUNTER — Other Ambulatory Visit: Payer: Self-pay

## 2022-02-04 ENCOUNTER — Ambulatory Visit (HOSPITAL_COMMUNITY)
Admission: EM | Admit: 2022-02-04 | Discharge: 2022-02-04 | Disposition: A | Discharge status: Home / Self Care | Payer: 59 | Attending: Psychiatry | Admitting: Psychiatry

## 2022-02-04 DIAGNOSIS — F22 Delusional disorders: Secondary | ICD-10-CM

## 2022-02-04 DIAGNOSIS — R440 Auditory hallucinations: Secondary | ICD-10-CM

## 2022-02-04 DIAGNOSIS — F419 Anxiety disorder, unspecified: Secondary | ICD-10-CM

## 2022-02-04 MED ORDER — QUETIAPINE FUMARATE 100 MG PO TABS: 100.0000 mg | ORAL_TABLET | Freq: Every day | ORAL | 0 refills | Status: DC

## 2022-02-04 MED ORDER — QUETIAPINE FUMARATE 50 MG PO TABS: 50.0000 mg | ORAL_TABLET | Freq: Every day | ORAL | 0 refills | Status: AC

## 2022-02-04 NOTE — BH Assessment (Signed)
LCSW Progress Note   Per Aura Fey, NP, this pt does not require psychiatric hospitalization at this time.  Pt is psychiatrically cleared.  Discharge instructions include several resources for outpatient therapy and medication management as well as an appointment for med management made by pt's provider at [facility].  EDP Aura Fey, NP, has been notified.  Omelia Blackwater, MSW, [place] [phone] or [phone]

## 2022-02-04 NOTE — Progress Notes (Signed)
   02/04/22 1247  [place] (Walk-ins at [facility] only)  How Did You Hear About [place]? Self  What Is the Reason for Your Visit/Call Today? Pt is a 56 yo female who presented voluntarily and accompnaied by her husband, Merry Proud, due to increasing anxiety and "noises in my head." Pt denied SI, HI, NSSH and all substance use except for weekly consumpsion of a few glassess of wine about once a week. Pt reported she is hearing "sizzling and noises in my head." Pt stated that she is sometimes hearing voices which are not commanding her to hurt herself or anyone else or do anything, She stated the voices are making comments on "things going on around me." Pt stated that she sometimes feels paranoid as if someone is watching her or planning to hurt her and she stated "I don;t know how or why." Pt stated that she is prescribed psychiatric medications by her GP for Bipolar d/o. Pt stated she does not see an OP therapist currently.  How Long Has This Been Causing You Problems? 1 wk - 1 month  Have You Recently Had Any Thoughts About Hurting Yourself? No  Are You Planning to Commit Suicide/Harm Yourself At This time? No  Have you Recently Had Thoughts About [place]? No  Are You Planning To Harm Someone At This Time? No  Are you currently experiencing any auditory, visual or other hallucinations? Yes  Have You Used Any Alcohol or Drugs in the Past 24 Hours? Yes  How long ago did you use Drugs or Alcohol? alcohol last night  What Did You Use and How Much? unknown amount  Do you have any current medical co-morbidities that require immediate attention? Yes  Please describe current medical co-morbidities that require immediate attention: Hoshimoto's Thyroid disease  Clinician description of patient physical appearance/behavior: Pt was tearful throughout, calm, cooperative and pleasant. Pt seemed alert and oriented. Pt did not appear to be responding to internal stimuli or intoxicated. Pt's speech,  movement and thought processes seemed within normal limits. Pt's judgment and insight seemed affected.  What Do You Feel Would Help You the Most Today? Treatment for Depression or other mood problem  Determination of Need Routine (7 days)  Options For Referral Medication Management;Outpatient Therapy   Kreg Earhart T. Mare Ferrari, [place], [facility], [facility] Triage Specialist [facility]

## 2022-02-04 NOTE — Discharge Instructions (Addendum)
Good afternoon, Ms. Blumstein!  You are scheduled for a virtual appointment with Dr. Nelida Gores of [place] on February 06, 2022 at 2:00PM. Please contact the office with any questions. The information is below:  [address] [place], [place], [address] [phone] phone (Medicare, Private insurance except Tricare, [place], and [facility])  Additional resources include:   [address] [place], [place], [address] [phone] phone ([address], [employer] [place], [place], [place], [place], [employer] Plans, [facility], BCBS Healthy Blue, Humana, [employer], [place], [place], [place], [place], Tricare, [facility], [facility] Community Plan, [facility])  [facility] [address] [place], [place], [address] [phone] phone (Call to confirm insurance coverage) Consultation & Support Services     o Drop-In Hours: 1:00 PM to 5:00 PM     o Days: Monday - Thursday  Crisis Services (24/7)   [facility] [address] [place], [place], [address] [phone] phone ([address] [place], [place], [place] Medicaid, [place] and [place], [facility])      [place] [address] [place], [place], [address] [phone] phone [email or web address]  (to complete the intake form and upload ID and insurance cards)  [place] [address] [place], [place], [address] [phone] phone [phone] fax (Medicaid, Medicare, Self-pay, call about other insurance coverage)  [facility] (age 1+) [address] [place], [place], [address] [phone] hone [phone] fax ([place], MedCost, [employer], [place], [place], [place], [place], [place], [place], [place], certain Medicare providers, [facility], [place])  [place], [place], [address] [phone] phone (Medicare, Medicaid, [place], call about other insurance coverage)  [place] [address] [place] [address] [place], [place], [address] [phone] phone [phone] fax (Call [phone] to see what insurance is accepted) Norma Fredrickson, MD specializes in geropsych)  [facility], [facility]  (medication management only) [address] [place], [place], [address] [phone] phone [phone] fax ([address] Medicaid, [place], [place], [place], [place], [place], [place], [place])  [facility] [address] [place], [place], [address] [phone] phone [phone] fax ([address], [place], [place], [place], [place], [facility], [place])  [facility], [place] [address] [phone] phone ([address], [place], [place], [place], [place], [place], [place], [facility]) Does genetic testing for medications; does transcranial magnetic stimulation along with basic services)  [facility] [address] [place], [place], [address] [phone] phone (Call about insurance coverage)  [facility] [address] [place], [place], [address] [phone] phone [phone] fax (Call about insurance coverage)  [place] [address] [place], [place], [address] [phone] phone [phone] fax (Call about insurance coverage)  [facility] [address] [place], [place], [address] [phone] phone (Medicaid, Medicare, call about other insurance coverage)  [facility] [place] [address] [place], [place], [address] [phone] phone [phone] fax (Medicaid, Medicare, Tricare, call about other insurance coverage)

## 2022-02-04 NOTE — ED Provider Notes (Signed)
Behavioral Health Urgent Care Medical Screening Exam  Patient Name: Kelli Adams MRN: [id number] Date of Evaluation: 02/04/22 Chief Complaint:   Diagnosis:  Final diagnoses:  Paranoia ([place])  Auditory hallucination  Anxiety   History of Present illness: Kelli Adams is a 56 y.o. female. Pt presents voluntarily to [facility] behavioral health for walk-in assessment.  Pt is accompanied by her husband, Kelli Adams. Pt is assessed face-to-face by nurse practitioner.   [name], 56 y.o., female patient seen face to face by this provider, consulted with Dr. Dwyane Dee; and chart reviewed on 02/04/22.  On evaluation Kelli Adams reports she is presenting today because "I've been very stressed out". Pt reports stressors include "managing life, husband, son". Pt reports her husband was in an accident recently and "almost lost his toe". She states she has had to help her husband out more. She states her son was "gone all summer to [place], now gone back to school". She states she misses her son. Pt reports reduced appetite and poor sleep. She reports she has been sleeping less than 5 hours/night. She reports she has been experiencing panic attacks, last experienced panic attack on Sunday and went to the ED.   Pt reports she has been experiencing auditory hallucinations. This has been occurring for the past couple of weeks. She states she hears "sizzling in my head". She states that she is currently experiencing the "sizzling". She states on the way to this facility, she was hearing voices telling her "just relax, there's nothing wrong". She reports in the past couple of weeks she has also been hearing "voices and songs". She denies command auditory hallucinations. She denies she has been experiencing visual hallucinations. She states prior to this, she has had similar occurrence happen in the past. She states 10 to 12 years ago, she experienced similar symptoms when her son was a  child. She states she was trialled on several medications although does not recall the name of any of these medications. She states that lamictal was the only medication that has been helpful for her. She states she was maintained on lamictal for years although she feels that it might not be working for her anymore. She states she is taking lamictal as prescribed daily. She was started on lamictal by a psychiatrist although has been getting refills from her general practitioner.  Pt reports paranoia that has been occurring for the past couple of weeks. She states she feels "people are trying to follow me, look at me, kill me". She notes there are "weird cars driving by I don't recognize".   Pt reports alcohol use. She states she uses alcohol once/week on Fridays. She states she has 6 drinks per occasion. She denies blacking out or experiencing withdrawal symptoms when not using. She reports use of nicotine. She denies use of other substances. Psychoeducation provided on the use of alcohol and nicotine on the body. Recommended pt decrease alcohol and nicotine use, abstinence if possible. Pt reports she is not interested in decreasing use at this time.   Pt denies suicidal ideation, homicidal ideation or violent ideation.   Pt reports she is living with her husband.  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  Pt reports highest level of education is Bachelor's degree.  She denies access to a firearm or other weapon.  Pt reports hx of anxiety and bipolar disorder.   Discussed w/ pt recommendation for admission to continued assessment w/ reassessment by psychiatry. Pt states  she does not want to stay at this facility. Pt states she does not want to hurt herself or anyone else. Discussed w/ pt if she is refusing to stay for treatment, I recommend close follow up by outpatient psychiatry. She gives verbal consent for writer to speak with her husband and for her husband to join the  assessment.   Discussed w/ pt, and pt's husband, Kelli Adams. Discussed recommendation for pt to be admitted to continuous assessment w/ reassessment by psychiatry. Pt's husband agrees with recommendation. Pt was adamant she does not want to stay. Pt's husband denies safety concerns w/ pt discharge. Pt's husband does not believe pt is a danger to herself or others. He states he can maintain safety for the pt on discharge. Pt gave verbal consent for writer to make an appointment for her. Writer called [facility] and scheduled appointment for 02/06/2022 at 2:00PM with Dr. Nelida Gores. This was relayed to the pt who agreed to attend the appointment. Discussed with pt starting seroquel '50mg'$ . Indications, risks, and benefits were discussed with the pt and her husband. 7 day script was provided at discharge. Pt and pt's husband agree that pt will be dispensed this medication to her by her husband. Discussed w/ pt and her husband should symptoms worsen, to go to the nearest emergency room, call 911/EMS or come back to this facility for another evaluation. Pt and pt's husband agree to do so.  Psychiatric Specialty Exam  Presentation  General Appearance:Appropriate for Environment; Casual; Fairly Groomed  Eye Contact:Fair  Speech:Clear and Coherent  Speech Volume:Normal  Handedness:No data recorded  Mood and Affect  Mood:Anxious; Depressed Affect:Tearful; Blunt  Thought Process  Thought Processes:Coherent; Goal Directed; Linear Descriptions of Associations:Intact  Orientation:Full (Time, Place and Person)  Thought Content:Delusions; Paranoid Ideation    Hallucinations:Auditory  Ideas of Reference:Paranoia; Delusions  Suicidal Thoughts:No  Homicidal Thoughts:No   Sensorium  Memory:Immediate Fair Judgment:Fair Insight:Fair  Executive Functions  Concentration:Fair Attention Span:Fair [place]  Psychomotor Activity  Psychomotor  Activity:Normal  Assets  Assets:Communication Skills; Desire for Improvement; Financial Resources/Insurance; Housing; Social Support  Sleep  Sleep:Poor Number of hours: No data recorded  No data recorded  Physical Exam: Physical Exam Cardiovascular:     Rate and Rhythm: Normal rate.  Pulmonary:     Effort: Pulmonary effort is normal.  Neurological:     Mental Status: She is alert and oriented to person, place, and time.  Psychiatric:        Attention and Perception: She perceives auditory hallucinations.        Mood and Affect: Mood is anxious and depressed. Affect is blunt and tearful.        Speech: Speech normal.        Behavior: Behavior normal. Behavior is cooperative.        Thought Content: Thought content is paranoid and delusional.    Review of Systems  Constitutional:  Negative for chills and fever.  Respiratory:  Negative for shortness of breath.   Cardiovascular:  Negative for chest pain.  Gastrointestinal:  Negative for abdominal pain.  Neurological:  Negative for headaches.  Psychiatric/Behavioral:  Positive for depression and hallucinations. The patient is nervous/anxious.    Blood pressure 137/87, pulse 97, temperature 98.2 F (36.8 C), temperature source Oral, resp. rate 18, SpO2 98 %. There is no height or weight on file to calculate BMI.  Musculoskeletal: Strength & Muscle Tone: within normal limits Gait & Station: normal Patient leans: N/A  [facility] MSE  Discharge Disposition for Follow up and Recommendations: Based on my evaluation the patient does not appear to have an emergency medical condition and can be discharged with resources and follow up care in outpatient services for Medication Management and Individual Therapy  Tharon Aquas, NP 02/04/2022, 4:49 PM

## 2022-02-06 ENCOUNTER — Telehealth (HOSPITAL_COMMUNITY): Payer: 59 | Admitting: Psychiatry

## 2022-04-23 ENCOUNTER — Other Ambulatory Visit: Payer: Self-pay | Admitting: Ophthalmology

## 2022-04-23 DIAGNOSIS — D3162 Benign neoplasm of unspecified site of left orbit: Secondary | ICD-10-CM

## 2022-05-21 ENCOUNTER — Ambulatory Visit
Admission: RE | Admit: 2022-05-21 | Discharge: 2022-05-21 | Disposition: A | Discharge status: Home / Self Care | Payer: 59 | Source: Ambulatory Visit | Attending: Ophthalmology | Admitting: Ophthalmology

## 2022-05-21 DIAGNOSIS — D3162 Benign neoplasm of unspecified site of left orbit: Secondary | ICD-10-CM

## 2022-05-21 MED ORDER — GADOPICLENOL 0.5 MMOL/ML IV SOLN
7.5000 mL | Freq: Once | INTRAVENOUS | Status: AC | PRN
  Administered 2022-05-21: 7.5 mL via INTRAVENOUS

## 2024-01-01 ENCOUNTER — Ambulatory Visit (INDEPENDENT_AMBULATORY_CARE_PROVIDER_SITE_OTHER): Payer: Self-pay | Admitting: Nurse Practitioner

## 2024-01-01 ENCOUNTER — Encounter: Payer: Self-pay | Admitting: Nurse Practitioner

## 2024-01-01 VITALS — BP 116/80 | HR 90 | Ht 62.0 in | Wt 171.2 lb

## 2024-01-01 DIAGNOSIS — R8789 Other abnormal findings in specimens from female genital organs: Secondary | ICD-10-CM

## 2024-01-01 DIAGNOSIS — Z0000 Encounter for general adult medical examination without abnormal findings: Secondary | ICD-10-CM

## 2024-01-01 NOTE — Progress Notes (Signed)
   Kelli Adams 01-29-1966 [id number]   History:  58 y.o. MWF G3 P1 presents as newly establish patient for abnormal pap. Referred by PCP. Last seen in our office in 2021. Pap completed by PCP 12/23/2023 - Normal + HR HPV neg 16/18. 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  Primary care manages hypertension, hypothyroidism, anxiety/depression.    Gynecologic History Patient's last menstrual period was 09/21/2019.   Contraception: post menopausal status  Health Maintenance Last Pap: 12/23/2023. Results were: Normal + HR HPV, neg 16/18 Last mammogram: 02/26/2023. Results were: Normal Last colonoscopy: 2020. Results: Normal Last Dexa: Not indicated     01/01/2024    9:00 AM  Depression screen PHQ 2/9  Decreased Interest 0  Down, Depressed, Hopeless 0  PHQ - 2 Score 0     Past medical history, past surgical history, family history and social history were all reviewed and documented in the EPIC chart.  Works for [employer]. Sister diagnosed with breast cancer at age 28, MGM in her 42s.   ROS:  A ROS was performed and pertinent positives and negatives are included.  Exam:  Vitals:   01/01/24 0901  BP: 116/80  Pulse: 90  SpO2: 96%  Weight: 171 lb 3.2 oz (77.7 kg)  Height: 5' 2 (1.575 m)   Body mass index is 31.31 kg/m.  General appearance:  Normal Skin  Inspection:  Grossly normal   Breasts: Examined lying and sitting.   Right: Without masses, retractions, discharge or axillary adenopathy.   Left: Without masses, retractions, discharge or axillary adenopathy. Gentitourinary Not indicated  Assessment/Plan:  58 y.o.  for annual exam.   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 Discussed recent pap results and ASCCP guidelines to repeat pap in 1 year.   Normal female breast exam - UTD on screenings.   Return in about 1 year (around 12/31/2024) for Annual.     Kelli Adams, 9:35 AM 01/01/2024

## 2025-01-04 ENCOUNTER — Ambulatory Visit: Admitting: Obstetrics and Gynecology
